# Patient Record
Sex: Female | Born: 1990 | Race: Black or African American | Hispanic: No | Marital: Single | Smoking: Never smoker
Health system: Southern US, Community
[De-identification: ages and names within clinical notes are randomized; demographics above are authoritative.]

## PROBLEM LIST (undated history)

## (undated) ENCOUNTER — Inpatient Hospital Stay (HOSPITAL_COMMUNITY): Payer: Self-pay

## (undated) DIAGNOSIS — Z8619 Personal history of other infectious and parasitic diseases: Secondary | ICD-10-CM

## (undated) HISTORY — DX: Personal history of other infectious and parasitic diseases: Z86.19

---
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
## 2011-02-21 IMAGING — CR DG CHEST 2V
2 series · 2 of 2 positions shown · non-contrast
Comparison: None

05/05/2009 – CORRECTED ORDERING PHYSICIAN:  Due to an administrative error, this report was originally sent to the wrong physician.  The report now reflects the correct ordering/requesting physician.
CLINICAL DATA: Cough and fever. Sore throat.

 CHEST - 2 VIEW

[view not recorded (1 of 2)]
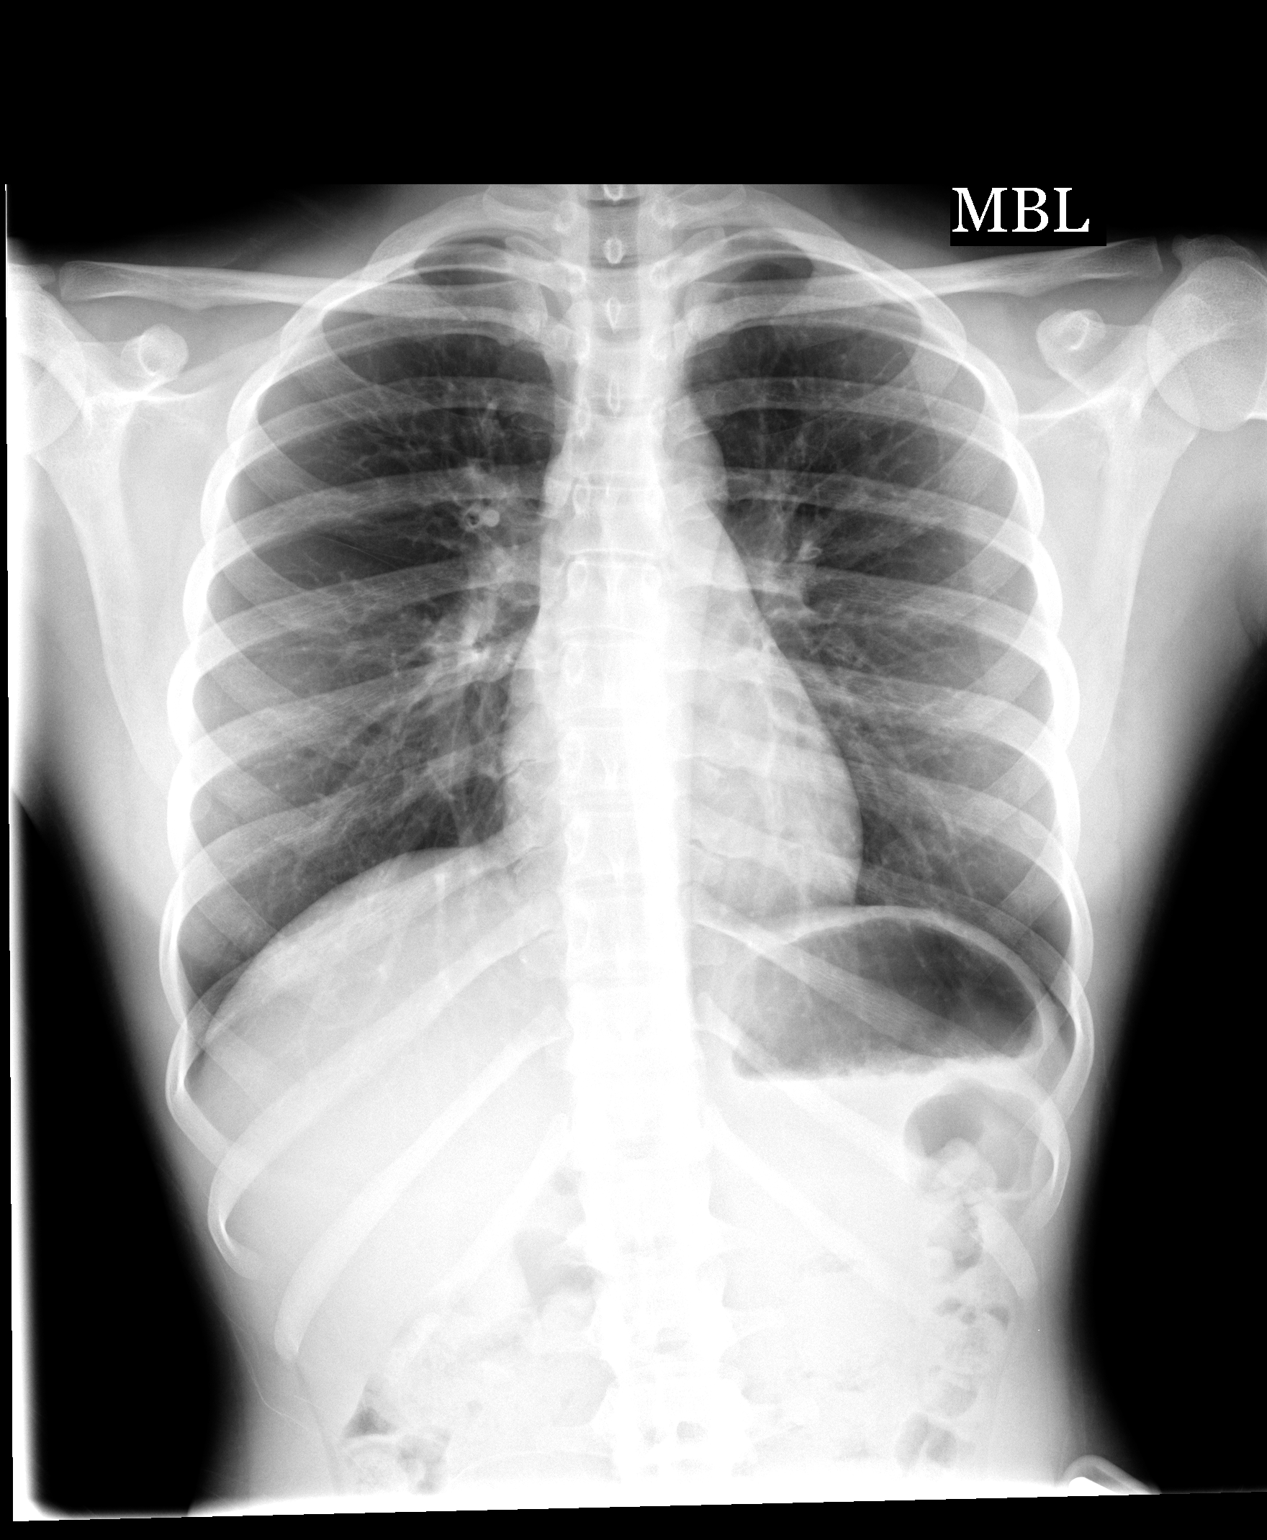

[view not recorded (2 of 2)]
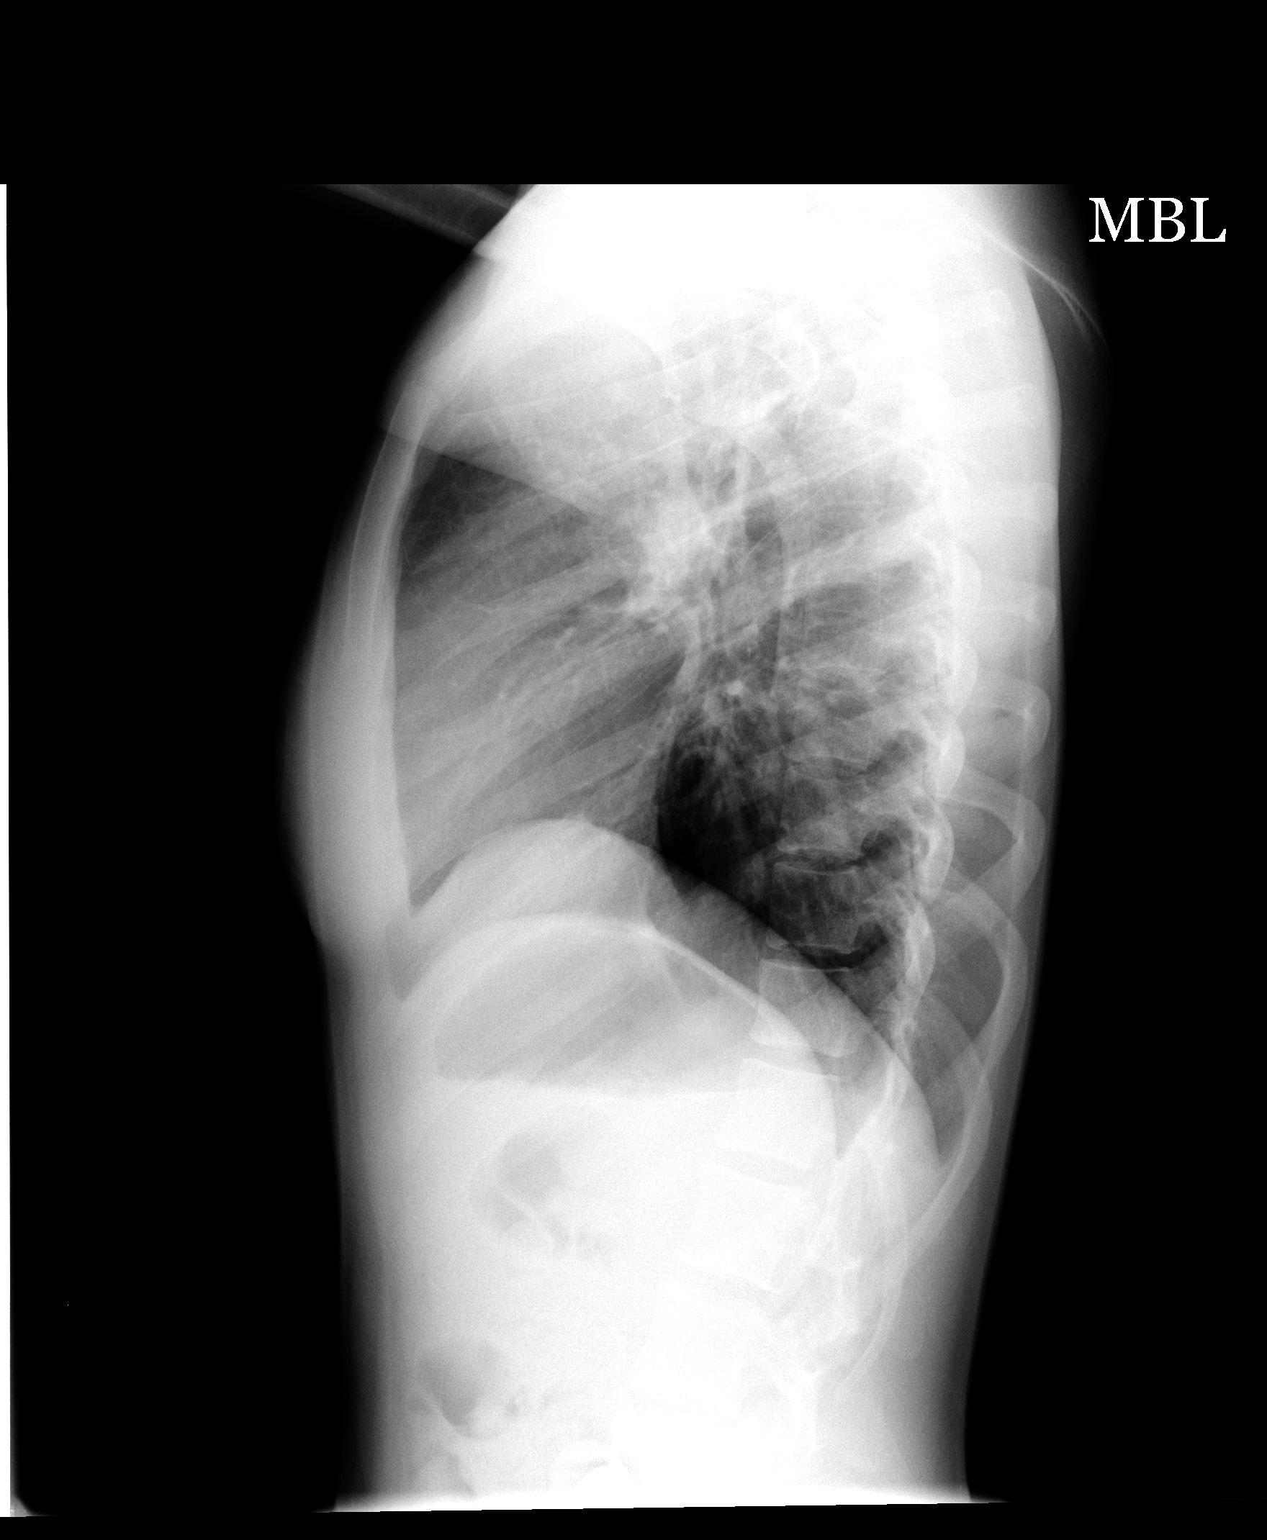

[2 of 2 positions shown; findings below may reference images not displayed]

FINDINGS: There is peribronchial thickening consistent with
 bronchitis.

 The heart size and vascularity are normal. There are no discrete
 infiltrates or effusions. No significant bony abnormality.
IMPRESSION: Mild bronchitic changes.

## 2012-12-08 ENCOUNTER — Emergency Department (HOSPITAL_COMMUNITY)
Admission: EM | Admit: 2012-12-08 | Discharge: 2012-12-08 | Disposition: A | Discharge status: Home / Self Care | Payer: Self-pay | Attending: Emergency Medicine | Admitting: Emergency Medicine

## 2012-12-08 ENCOUNTER — Emergency Department (HOSPITAL_COMMUNITY)
Admission: EM | Admit: 2012-12-08 | Discharge: 2012-12-08 | Disposition: A | Discharge status: Home / Self Care | Payer: Self-pay | Source: Home / Self Care

## 2012-12-08 ENCOUNTER — Encounter (HOSPITAL_COMMUNITY): Payer: Self-pay | Admitting: Emergency Medicine

## 2012-12-08 DIAGNOSIS — R112 Nausea with vomiting, unspecified: Secondary | ICD-10-CM | POA: Insufficient documentation

## 2012-12-08 DIAGNOSIS — K2900 Acute gastritis without bleeding: Secondary | ICD-10-CM | POA: Insufficient documentation

## 2012-12-08 DIAGNOSIS — K2970 Gastritis, unspecified, without bleeding: Secondary | ICD-10-CM

## 2012-12-08 DIAGNOSIS — Z3202 Encounter for pregnancy test, result negative: Secondary | ICD-10-CM | POA: Insufficient documentation

## 2012-12-08 LAB — CBC WITH DIFFERENTIAL/PLATELET
Basophils Absolute: 0 10*3/uL (ref 0.0–0.1)
Basophils Relative: 0 % (ref 0–1)
Eosinophils Relative: 0 % (ref 0–5)
HCT: 37.6 % (ref 36.0–46.0)
Lymphocytes Relative: 19 % (ref 12–46)
MCHC: 35.4 g/dL (ref 30.0–36.0)
MCV: 78 fL (ref 78.0–100.0)
Monocytes Absolute: 0.4 10*3/uL (ref 0.1–1.0)
Platelets: 231 10*3/uL (ref 150–400)
RDW: 13 % (ref 11.5–15.5)
WBC: 7 10*3/uL (ref 4.0–10.5)

## 2012-12-08 LAB — COMPREHENSIVE METABOLIC PANEL
ALT: 14 U/L (ref 0–35)
AST: 22 U/L (ref 0–37)
Albumin: 4.5 g/dL (ref 3.5–5.2)
CO2: 19 mEq/L (ref 19–32)
Calcium: 9.4 mg/dL (ref 8.4–10.5)
Creatinine, Ser: 0.77 mg/dL (ref 0.50–1.10)
GFR calc non Af Amer: >90 mL/min (ref >90)
Sodium: 138 mEq/L (ref 135–145)
Total Protein: 7.8 g/dL (ref 6.0–8.3)

## 2012-12-08 LAB — URINALYSIS, ROUTINE W REFLEX MICROSCOPIC
Glucose, UA: NEGATIVE mg/dL
Hgb urine dipstick: NEGATIVE
Specific Gravity, Urine: 1.036 — ABNORMAL HIGH (ref 1.005–1.030)
Urobilinogen, UA: 0.2 mg/dL (ref 0.0–1.0)
pH: 6 (ref 5.0–8.0)

## 2012-12-08 LAB — URINE MICROSCOPIC-ADD ON

## 2012-12-08 LAB — POCT PREGNANCY, URINE: Preg Test, Ur: NEGATIVE

## 2012-12-08 MED ORDER — ONDANSETRON 4 MG PO TBDP
4.0000 mg | ORAL_TABLET | Freq: Once | ORAL | Status: AC
  Administered 2012-12-08: 4 mg via ORAL
  Filled 2012-12-08: qty 1

## 2012-12-08 MED ORDER — ONDANSETRON HCL 4 MG PO TABS: 4.0000 mg | ORAL_TABLET | Freq: Four times a day (QID) | ORAL | Status: DC

## 2012-12-08 MED ORDER — POTASSIUM CHLORIDE CRYS ER 20 MEQ PO TBCR
40.0000 meq | EXTENDED_RELEASE_TABLET | Freq: Once | ORAL | Status: AC
  Administered 2012-12-08: 40 meq via ORAL
  Filled 2012-12-08: qty 2

## 2012-12-08 NOTE — ED Provider Notes (Signed)
Medical screening examination/treatment/procedure(s) were performed by non-physician practitioner and as supervising physician I was immediately available for consultation/collaboration.   Audree Camel, MD 12/08/12 [phone]

## 2012-12-08 NOTE — ED Provider Notes (Signed)
CSN: [id number]     Arrival date & time 12/08/12  1104 History   First MD Initiated Contact with Patient 12/08/12 1158     Chief Complaint  Patient presents with  . Nausea  . Emesis   (Consider location/radiation/quality/duration/timing/severity/associated sxs/prior Treatment) The history is provided by the patient. No language interpreter was used.  Lynn Wright is a 22 year old female sent into the emergency department 2 days of emesis and nausea. Patient reported that today she had one episode of emesis while yesterday she had a least more than 5 episodes starting around 2:00 PM. Patient reported that she doesn't have any abdominal pain, reported abdominal soreness secondary to continuous emesis. Patient reported that she's been feeling rather nauseous throughout the day. Patient reported that she try to drink ginger ale and eat pizza this morning, reported that the food came right back up. Patient reported that her last bowel movement was yesterday. Denied abdominal pain, headache, dizziness, chest pain, shortness of breath, difficulty breathing, hematuria, dysuria, melena, hematochezia, diarrhea. PCP none  History reviewed. No pertinent past medical history. History reviewed. No pertinent past surgical history. No family history on file. History  Substance Use Topics  . Smoking status: Never Smoker   . Smokeless tobacco: Not on file  . Alcohol Use: No   OB History   Grav Para Term Preterm Abortions TAB SAB Ect Mult Living                 Review of Systems  Constitutional: Negative for fever and chills.  HENT: Negative for trouble swallowing and neck pain.   Eyes: Negative for visual disturbance.  Respiratory: Negative for chest tightness and shortness of breath.   Cardiovascular: Negative for chest pain.  Gastrointestinal: Positive for nausea and vomiting. Negative for abdominal pain, diarrhea, constipation, blood in stool and anal bleeding.  Neurological: Negative for  dizziness and weakness.  All other systems reviewed and are negative.    Allergies  Review of patient's allergies indicates no known allergies.  Home Medications   Current Outpatient Rx  Name  Route  Sig  Dispense  Refill  . Ibuprofen (IBU PO)   Oral   Take 1-2 tablets by mouth every 6 (six) hours as needed (pain).         . ondansetron (ZOFRAN) 4 MG tablet   Oral   Take 1 tablet (4 mg total) by mouth every 6 (six) hours.   12 tablet   0    BP 106/72  Pulse 118  Temp(Src) 98.6 F (37 C) (Oral)  Resp 18  Ht 4\' 11"  (1.499 m)  SpO2 100%  LMP 11/30/2012 Physical Exam  Nursing note and vitals reviewed. Constitutional: She is oriented to person, place, and time. She appears well-developed and well-nourished. No distress.  HENT:  Head: Normocephalic and atraumatic.  Mouth/Throat: Oropharynx is clear and moist. No oropharyngeal exudate.  Mucous membranes appear moist  Eyes: Conjunctivae and EOM are normal. Pupils are equal, round, and reactive to light. Right eye exhibits no discharge. Left eye exhibits no discharge.  Neck: Normal range of motion. Neck supple.  Negative neck stiffness Negative nuchal rigidity Negative lymphadenopathy Negative cervical spine tenderness upon palpation  Cardiovascular: Normal rate, regular rhythm and normal heart sounds.  Exam reveals no friction rub.   No murmur heard. Pulses:      Radial pulses are 2+ on the right side, and 2+ on the left side.       Dorsalis pedis pulses are 2+ on  the right side, and 2+ on the left side.  Tachycardia not noted upon auscultation, normal rhythm  Pulmonary/Chest: Effort normal and breath sounds normal. No respiratory distress. She has no wheezes. She has no rales.  Abdominal: Soft. Bowel sounds are normal. She exhibits no distension. There is no tenderness. There is no rebound and no guarding.  Musculoskeletal: Normal range of motion.  Lymphadenopathy:    She has no cervical adenopathy.  Neurological:  She is alert and oriented to person, place, and time. She exhibits normal muscle tone. Coordination normal.  Skin: Skin is warm and dry. No rash noted. She is not diaphoretic. No erythema.  Psychiatric: She has a normal mood and affect. Her behavior is normal. Thought content normal.    ED Course  Procedures (including critical care time)  1:10 PM Nurse came to this provider if any patient can have anything by mouth, nurse reported that patient is hungry and wants to eat.  3:03 PM Patient reassessed. Patient sitting comfortably in bed. Patient tolerated by mouth challenge with crackers and ginger ale. No episodes of vomiting while in the emergency department.  Labs Review Labs Reviewed  COMPREHENSIVE METABOLIC PANEL - Abnormal; Notable for the following:    Potassium 3.3 (*)    All other components within normal limits  URINALYSIS, ROUTINE W REFLEX MICROSCOPIC - Abnormal; Notable for the following:    APPearance CLOUDY (*)    Specific Gravity, Urine 1.036 (*)    Bilirubin Urine SMALL (*)    Ketones, ur >80 (*)    Protein, ur 30 (*)    All other components within normal limits  CBC WITH DIFFERENTIAL  URINE MICROSCOPIC-ADD ON  POCT PREGNANCY, URINE   Imaging Review No results found.  MDM   1. Gastritis   2. Nausea and vomiting      Patient presenting to emergency department with nausea and vomiting has been ongoing for the past 2 days. Patient denied any abdominal pain, diarrhea, melena, hematochezia. Alert and oriented. Patient laughing smiling sitting upright in bed. Negative acute abdomen, negative peritoneal signs. Bowel sounds normoactive in all 4 quadrants. Negative pain upon palpation to all 4 quadrants. Moist mucous membranes noted. Urine negative for pregnancy. Urine negative for infection or leukocytosis, dehydration noted. CBC negative white blood cell elevation, negative findings. CMP mild decrease in potassium, 3.3, potassium by mouth given. Patient stable,  afebrile - aseptic appearing. Suspicion to be possible gastritis, nausea and emesis. No episodes of emesis while in ED setting. Patient tolerated PO fluids and crackers. Patient reported that she is feeling better. Discussed case with Dr. Alric Ran, cleared patient for discharge. Discharged patient with anti-emetics medications to be used as needed. Discussed with patient to rest and stay hydrated. [facility]. Discussed diet with patient. Discussed with patient to continue to monitor symptoms and if symptoms are to worsen or change report back to emergency department to return structures given. Patient agreed to plan of care, understood, all questions answered.    Raymon Mutton, PA-C 12/08/12 1704

## 2012-12-08 NOTE — ED Notes (Addendum)
Pt c/o N/V x 3 days. Pt reports muscle pain in abdominal area. Pt ate pizza today and vomited. Pt denies vaginal discharge or urinary symptoms.

## 2013-04-01 ENCOUNTER — Encounter (HOSPITAL_COMMUNITY): Payer: Self-pay | Admitting: *Deleted

## 2013-04-01 ENCOUNTER — Inpatient Hospital Stay (HOSPITAL_COMMUNITY)
Admission: AD | Admit: 2013-04-01 | Discharge: 2013-04-01 | Disposition: A | Discharge status: Home / Self Care | Payer: Medicaid Other | Source: Ambulatory Visit | Attending: Obstetrics & Gynecology | Admitting: Obstetrics & Gynecology

## 2013-04-01 ENCOUNTER — Inpatient Hospital Stay (HOSPITAL_COMMUNITY): Payer: Medicaid Other

## 2013-04-01 DIAGNOSIS — O2390 Unspecified genitourinary tract infection in pregnancy, unspecified trimester: Secondary | ICD-10-CM | POA: Insufficient documentation

## 2013-04-01 DIAGNOSIS — O210 Mild hyperemesis gravidarum: Secondary | ICD-10-CM | POA: Insufficient documentation

## 2013-04-01 DIAGNOSIS — R109 Unspecified abdominal pain: Secondary | ICD-10-CM | POA: Insufficient documentation

## 2013-04-01 DIAGNOSIS — B373 Candidiasis of vulva and vagina: Secondary | ICD-10-CM

## 2013-04-01 DIAGNOSIS — O219 Vomiting of pregnancy, unspecified: Secondary | ICD-10-CM

## 2013-04-01 DIAGNOSIS — O26899 Other specified pregnancy related conditions, unspecified trimester: Secondary | ICD-10-CM

## 2013-04-01 DIAGNOSIS — B3731 Acute candidiasis of vulva and vagina: Secondary | ICD-10-CM | POA: Insufficient documentation

## 2013-04-01 LAB — POCT PREGNANCY, URINE: Preg Test, Ur: POSITIVE — AB

## 2013-04-01 LAB — HCG, QUANTITATIVE, PREGNANCY: hCG, Beta Chain, Quant, S: 151911 m[IU]/mL — ABNORMAL HIGH

## 2013-04-01 LAB — CBC
HCT: 31.9 % — ABNORMAL LOW (ref 36.0–46.0)
Hemoglobin: 11 g/dL — ABNORMAL LOW (ref 12.0–15.0)
MCH: 26.6 pg (ref 26.0–34.0)
MCHC: 34.5 g/dL (ref 30.0–36.0)
MCV: 77.1 fL — ABNORMAL LOW (ref 78.0–100.0)
Platelets: 207 10*3/uL (ref 150–400)
RBC: 4.14 MIL/uL (ref 3.87–5.11)
RDW: 12.8 % (ref 11.5–15.5)
WBC: 6.9 10*3/uL (ref 4.0–10.5)

## 2013-04-01 LAB — WET PREP, GENITAL: Clue Cells Wet Prep HPF POC: NONE SEEN

## 2013-04-01 LAB — URINALYSIS, ROUTINE W REFLEX MICROSCOPIC
Glucose, UA: NEGATIVE mg/dL
Ketones, ur: NEGATIVE mg/dL
Leukocytes, UA: NEGATIVE
Protein, ur: NEGATIVE mg/dL
Urobilinogen, UA: 0.2 mg/dL (ref 0.0–1.0)

## 2013-04-01 LAB — ABO/RH: ABO/RH(D): O POS

## 2013-04-01 MED ORDER — ONDANSETRON 8 MG PO TBDP
8.0000 mg | ORAL_TABLET | Freq: Once | ORAL | Status: AC
  Administered 2013-04-01: 8 mg via ORAL
  Filled 2013-04-01: qty 1

## 2013-04-01 MED ORDER — FLUCONAZOLE 150 MG PO TABS: 150.0000 mg | ORAL_TABLET | Freq: Once | ORAL | Status: DC

## 2013-04-01 MED ORDER — PROMETHAZINE HCL 25 MG PO TABS: 25.0000 mg | ORAL_TABLET | Freq: Four times a day (QID) | ORAL | Status: DC | PRN

## 2013-04-01 NOTE — MAU Provider Note (Signed)
History     CSN: [id number]  Arrival date and time: 04/01/13 1529   None     Chief Complaint  Patient presents with  . Possible Pregnancy  . Abdominal Pain   HPI Pt is G1P0  [redacted]w[redacted]d pregnant  RN note: Patient states she had a slightly positive home pregnancy test 2 days ago. Has had abdominal pain for about one week, worse today, worse on the left side.  The pain is episdoic described as aching.Pt has been nauseated and vomiting in the mornings and at night.  Pt ate last about 2 pm and ate meatloaf and green beans. Denies bleeding, discharge, nausea or vomiting. Pt experienced some pain with urination this last episode.  Pt denies frequency of urination or incomoplete emptying of bladder.    No past medical history on file.  No past surgical history on file.  No family history on file.  History  Substance Use Topics  . Smoking status: Never Smoker   . Smokeless tobacco: Not on file  . Alcohol Use: No    Allergies: No Known Allergies  Prescriptions prior to admission  Medication Sig Dispense Refill  . Ibuprofen (IBU PO) Take 1-2 tablets by mouth every 6 (six) hours as needed (pain).      . ondansetron (ZOFRAN) 4 MG tablet Take 1 tablet (4 mg total) by mouth every 6 (six) hours.  12 tablet  0    ROS Physical Exam   Blood pressure 125/66, pulse 107, temperature 98.8 F (37.1 C), temperature source Oral, resp. rate 16, height 5' (1.524 m), weight 43.364 kg (95 lb 9.6 oz), SpO2 100.00%.  Physical Exam  Nursing note and vitals reviewed. Constitutional: She is oriented to person, place, and time. She appears well-developed and well-nourished. No distress.  HENT:  Head: Normocephalic.  Eyes: Pupils are equal, round, and reactive to light.  Neck: Normal range of motion. Neck supple.  Cardiovascular: Normal rate.   Respiratory: Effort normal.  GI: Soft. She exhibits no distension. There is no tenderness. There is no rebound and no guarding.  Genitourinary:  Creamy white  vaginal discharge in vaginal vault; uterus anteflexed 8-10 week size NT; adnexa without palpable enlargement or tenderness  Musculoskeletal: Normal range of motion.  Neurological: She is alert and oriented to person, place, and time.  Skin: Skin is warm and dry.  Psychiatric: She has a normal mood and affect.    MAU Course  Procedures Results for orders placed during the hospital encounter of 04/01/13 (from the past 24 hour(s))  URINALYSIS, ROUTINE W REFLEX MICROSCOPIC     Status: None   Collection Time    04/01/13  3:45 PM      Result Value Range   Color, Urine YELLOW  YELLOW   APPearance CLEAR  CLEAR   Specific Gravity, Urine 1.025  1.005 - 1.030   pH 7.0  5.0 - 8.0   Glucose, UA NEGATIVE  NEGATIVE mg/dL   Hgb urine dipstick NEGATIVE  NEGATIVE   Bilirubin Urine NEGATIVE  NEGATIVE   Ketones, ur NEGATIVE  NEGATIVE mg/dL   Protein, ur NEGATIVE  NEGATIVE mg/dL   Urobilinogen, UA 0.2  0.0 - 1.0 mg/dL   Nitrite NEGATIVE  NEGATIVE   Leukocytes, UA NEGATIVE  NEGATIVE  POCT PREGNANCY, URINE     Status: Abnormal   Collection Time    04/01/13  3:48 PM      Result Value Range   Preg Test, Ur POSITIVE (*) NEGATIVE  WET PREP, GENITAL  Status: Abnormal   Collection Time    04/01/13  4:27 PM      Result Value Range   Yeast Wet Prep HPF POC FEW (*) NONE SEEN   Trich, Wet Prep NONE SEEN  NONE SEEN   Clue Cells Wet Prep HPF POC NONE SEEN  NONE SEEN   WBC, Wet Prep HPF POC FEW (*) NONE SEEN  CBC     Status: Abnormal   Collection Time    04/01/13  4:30 PM      Result Value Range   WBC 6.9  4.0 - 10.5 K/uL   RBC 4.14  3.87 - 5.11 MIL/uL   Hemoglobin 11.0 (*) 12.0 - 15.0 g/dL   HCT 62.1 (*) 30.8 - 65.7 %   MCV 77.1 (*) 78.0 - 100.0 fL   MCH 26.6  26.0 - 34.0 pg   MCHC 34.5  30.0 - 36.0 g/dL   RDW 84.6  96.2 - 95.2 %   Platelets 207  150 - 400 K/uL  ABO/RH     Status: None   Collection Time    04/01/13  4:30 PM      Result Value Range   ABO/RH(D) O POS     US Ob Comp Less 14  Wks  04/01/2013   CLINICAL DATA:  Abdominal cramping. Eight weeks and 3 days pregnant by last menstrual period. Quantitative beta HCG pending.  EXAM: OBSTETRIC <14 WK ULTRASOUND  TECHNIQUE: Transabdominal ultrasound was performed for evaluation of the gestation as well as the maternal uterus and adnexal regions.  COMPARISON:  None.  FINDINGS: Intrauterine gestational sac: Visualized/normal in shape.  Yolk sac:  Visualized/ normal in shape.  Embryo:  Visualized  Cardiac Activity: Visualized  Heart Rate: 158 bpm  CRL:   22.7  mm   9 w 0 d                  [place] EDC: 11/04/2013  Maternal uterus/adnexae: No subchorionic hemorrhage. Normal appearing maternal ovaries. No free peritoneal fluid.  IMPRESSION: Single live intrauterine gestation with an estimated gestational age of [redacted] weeks and 0 days. No complicating features.   Electronically Signed   By: Gordan Payment M.D.   On: 04/01/2013 17:14   Assessment and Plan  abd pain in pregnancy  Viable 9w 0 d IUP Nausea and vomiting in pregnancy Yeast vaginitis- Diflucan 150mg  #2- one now repeat 3 days for yeast  F/u for OB care  Kartier Bennison 04/01/2013, 3:40 PM

## 2013-04-01 NOTE — MAU Note (Signed)
Patient states she had a slightly positive home pregnancy test 2 days ago. Has had abdominal pain for about one week, worse today. Denies bleeding, discharge, nausea or vomiting.

## 2013-04-02 LAB — GC/CHLAMYDIA PROBE AMP: GC Probe RNA: NEGATIVE

## 2013-04-02 NOTE — MAU Provider Note (Signed)
Attestation of Attending Supervision of Advanced Practitioner (PA/CNM/NP): Evaluation and management procedures were performed by the Advanced Practitioner under my supervision and collaboration.  I have reviewed the Advanced Practitioner's note and chart, and I agree with the management and plan.  Sharone Picchi, MD, FACOG Attending Obstetrician & Gynecologist Faculty Practice, [facility]  

## 2013-04-09 ENCOUNTER — Emergency Department (HOSPITAL_COMMUNITY)
Admission: EM | Admit: 2013-04-09 | Discharge: 2013-04-09 | Disposition: A | Discharge status: Home / Self Care | Payer: Medicaid Other | Attending: Emergency Medicine | Admitting: Emergency Medicine

## 2013-04-09 ENCOUNTER — Encounter (HOSPITAL_COMMUNITY): Payer: Self-pay | Admitting: Emergency Medicine

## 2013-04-09 DIAGNOSIS — R059 Cough, unspecified: Secondary | ICD-10-CM | POA: Insufficient documentation

## 2013-04-09 DIAGNOSIS — Z79899 Other long term (current) drug therapy: Secondary | ICD-10-CM | POA: Insufficient documentation

## 2013-04-09 DIAGNOSIS — R05 Cough: Secondary | ICD-10-CM

## 2013-04-09 DIAGNOSIS — R000 Tachycardia, unspecified: Secondary | ICD-10-CM | POA: Insufficient documentation

## 2013-04-09 DIAGNOSIS — O9989 Other specified diseases and conditions complicating pregnancy, childbirth and the puerperium: Secondary | ICD-10-CM | POA: Insufficient documentation

## 2013-04-09 NOTE — ED Provider Notes (Signed)
CSN: [id number]     Arrival date & time 04/09/13  0631 History   First MD Initiated Contact with Patient 04/09/13 [phone]     Chief Complaint  Patient presents with  . Cough   (Consider location/radiation/quality/duration/timing/severity/associated sxs/prior Treatment) HPI Comments: Patient presents to the ED with a chief complaint of cough x 1 week.  She is [redacted] weeks pregnant.  She denies fevers or chills.  Denies any other associated symptoms such as nausea, vomiting, diarrhea, constipation, or body aches.  She states that the cough is sometimes productive.  She denies any other health problems.    The history is provided by the patient. No language interpreter was used.    History reviewed. No pertinent past medical history. History reviewed. No pertinent past surgical history. No family history on file. History  Substance Use Topics  . Smoking status: Never Smoker   . Smokeless tobacco: Not on file  . Alcohol Use: No   OB History   Grav Para Term Preterm Abortions TAB SAB Ect Mult Living   1              Review of Systems  All other systems reviewed and are negative.    Allergies  Review of patient's allergies indicates no known allergies.  Home Medications   Current Outpatient Rx  Name  Route  Sig  Dispense  Refill  . fluconazole (DIFLUCAN) 150 MG tablet   Oral   Take 1 tablet (150 mg total) by mouth once.   2 tablet   3   . promethazine (PHENERGAN) 25 MG tablet   Oral   Take 1 tablet (25 mg total) by mouth every 6 (six) hours as needed for nausea or vomiting.   30 tablet   0    BP 111/71  Pulse 102  Temp(Src) 97.7 F (36.5 C) (Oral)  Resp 16  Ht 4\' 11"  (1.499 m)  Wt 101 lb (45.813 kg)  BMI 20.39 kg/m2  SpO2 100%  LMP 02/01/2013 Physical Exam  Nursing note and vitals reviewed. Constitutional: She is oriented to person, place, and time. She appears well-developed and well-nourished.  HENT:  Head: Normocephalic and atraumatic.  Eyes: Conjunctivae and  EOM are normal. Pupils are equal, round, and reactive to light.  Neck: Normal range of motion. Neck supple.  Cardiovascular: Regular rhythm.  Exam reveals no gallop and no friction rub.   No murmur heard. Mildly tachycardic  Pulmonary/Chest: Effort normal and breath sounds normal. No respiratory distress. She has no wheezes. She has no rales. She exhibits no tenderness.  Clear to auscultation bilaterally  Abdominal: Soft. She exhibits no distension and no mass. There is no tenderness. There is no rebound and no guarding.  No focal abdominal tenderness  Musculoskeletal: Normal range of motion. She exhibits no edema and no tenderness.  Neurological: She is alert and oriented to person, place, and time.  Skin: Skin is warm and dry.  Psychiatric: She has a normal mood and affect. Her behavior is normal. Judgment and thought content normal.    ED Course  Procedures (including critical care time) Labs Review Labs Reviewed - No data to display Imaging Review No results found.  EKG Interpretation   None       MDM   1. Cough     Patients symptoms are consistent with URI, likely viral etiology. Discussed that antibiotics are not indicated for viral infections. Pt will be discharged with symptomatic treatment.  Verbalizes understanding and is agreeable with plan. Pt  is hemodynamically stable & in NAD prior to dc.       Roxy Horseman, PA-C 04/09/13 [phone]

## 2013-04-09 NOTE — ED Notes (Addendum)
Pt. reports persistent productive cough with chest congestion for several days , denies fever or chills, respirations unlabored. Pt. states she is [redacted] weeks pregnant.

## 2013-04-09 NOTE — ED Provider Notes (Signed)
Medical screening examination/treatment/procedure(s) were performed by non-physician practitioner and as supervising physician I was immediately available for consultation/collaboration.  EKG Interpretation   None        Hurman Horn, MD 04/09/13 2103

## 2013-04-24 ENCOUNTER — Encounter (HOSPITAL_COMMUNITY): Payer: Self-pay | Admitting: Emergency Medicine

## 2013-04-24 ENCOUNTER — Emergency Department (HOSPITAL_COMMUNITY)
Admission: EM | Admit: 2013-04-24 | Discharge: 2013-04-24 | Disposition: A | Discharge status: Home / Self Care | Payer: Medicaid Other | Attending: Emergency Medicine | Admitting: Emergency Medicine

## 2013-04-24 DIAGNOSIS — O219 Vomiting of pregnancy, unspecified: Secondary | ICD-10-CM

## 2013-04-24 DIAGNOSIS — Z3490 Encounter for supervision of normal pregnancy, unspecified, unspecified trimester: Secondary | ICD-10-CM

## 2013-04-24 DIAGNOSIS — O210 Mild hyperemesis gravidarum: Secondary | ICD-10-CM | POA: Insufficient documentation

## 2013-04-24 LAB — URINALYSIS, ROUTINE W REFLEX MICROSCOPIC
Bilirubin Urine: NEGATIVE
Glucose, UA: NEGATIVE mg/dL
HGB URINE DIPSTICK: NEGATIVE
Ketones, ur: >80 mg/dL — AB
Leukocytes, UA: NEGATIVE
Nitrite: NEGATIVE
Protein, ur: NEGATIVE mg/dL
Specific Gravity, Urine: 1.034 — ABNORMAL HIGH (ref 1.005–1.030)
UROBILINOGEN UA: 0.2 mg/dL (ref 0.0–1.0)
pH: 6 (ref 5.0–8.0)

## 2013-04-24 LAB — BASIC METABOLIC PANEL
BUN: 13 mg/dL (ref 6–23)
CALCIUM: 9.2 mg/dL (ref 8.4–10.5)
CO2: 22 meq/L (ref 19–32)
Chloride: 99 mEq/L (ref 96–112)
Creatinine, Ser: 0.54 mg/dL (ref 0.50–1.10)
Glucose, Bld: 88 mg/dL (ref 70–99)
Potassium: 3.6 mEq/L — ABNORMAL LOW (ref 3.7–5.3)
SODIUM: 136 meq/L — AB (ref 137–147)

## 2013-04-24 LAB — CBC
HCT: 33.1 % — ABNORMAL LOW (ref 36.0–46.0)
Hemoglobin: 11.4 g/dL — ABNORMAL LOW (ref 12.0–15.0)
MCH: 27.2 pg (ref 26.0–34.0)
MCHC: 34.4 g/dL (ref 30.0–36.0)
MCV: 79 fL (ref 78.0–100.0)
PLATELETS: 227 10*3/uL (ref 150–400)
RBC: 4.19 MIL/uL (ref 3.87–5.11)
RDW: 13.1 % (ref 11.5–15.5)
WBC: 6.1 10*3/uL (ref 4.0–10.5)

## 2013-04-24 LAB — PREGNANCY, URINE: Preg Test, Ur: POSITIVE — AB

## 2013-04-24 MED ORDER — SODIUM CHLORIDE 0.9 % IV BOLUS (SEPSIS)
1000.0000 mL | Freq: Once | INTRAVENOUS | Status: AC
  Administered 2013-04-24: 1000 mL via INTRAVENOUS

## 2013-04-24 MED ORDER — ONDANSETRON HCL 4 MG PO TABS: 4.0000 mg | ORAL_TABLET | Freq: Four times a day (QID) | ORAL | Status: DC

## 2013-04-24 MED ORDER — ONDANSETRON HCL 4 MG/2ML IJ SOLN
4.0000 mg | Freq: Once | INTRAMUSCULAR | Status: AC
  Administered 2013-04-24: 4 mg via INTRAVENOUS
  Filled 2013-04-24: qty 2

## 2013-04-24 NOTE — ED Notes (Signed)
Pt ambulatory to bathroom

## 2013-04-24 NOTE — ED Notes (Signed)
Pt given applesauce. Pt asking to see MD. Pt appears to anxious to leave ED
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
## 2013-04-24 NOTE — Discharge Instructions (Signed)
Pregnancy, First Trimester The first trimester is the first 3 months your baby is growing inside you. It is important to follow your doctor's instructions. HOME CARE   Do not smoke.  Do not drink alcohol.  Only take medicine as told by your doctor.  Exercise.  Eat healthy foods. Eat regular, well-balanced meals.  You can have sex (intercourse) if there are no other problems with the pregnancy.  Things that help with morning sickness:  Eat soda crackers before getting up in the morning.  Eat 4 to 5 small meals rather than 3 large meals.  Drink liquids between meals, not during meals.  Go to all appointments as told.  Take all vitamins or supplements as told by your doctor. GET HELP RIGHT AWAY IF:   You develop a fever.  You have a bad smelling fluid that is leaking from your vagina.  There is bleeding from the vagina.  You develop severe belly (abdominal) or back pain.  You throw up (vomit) blood. It may look like coffee grounds.  You lose more than 2 pounds in a week.  You gain 5 pounds or more in a week.  You gain more than 2 pounds in a week and you see puffiness (swelling) in your feet, ankles, or legs.  You have severe dizziness or pass out (faint).  You are around people who have [place] measles, chickenpox, or fifth disease.  You have a headache, watery poop (diarrhea), pain with peeing (urinating), or cannot breath right. Document Released: 09/13/2007 Document Revised: 06/19/2011 Document Reviewed: 09/13/2007 [facility] Patient Information 2014 [place], [place].  Morning Sickness Morning sickness is when you feel sick to your stomach (nauseous) during pregnancy. This nauseous feeling may or may not come with vomiting. It often occurs in the morning but can be a problem any time of day. Morning sickness is most common during the first trimester, but it may continue throughout pregnancy. While morning sickness is unpleasant, it is usually harmless unless you  develop severe and continual vomiting (hyperemesis gravidarum). This condition requires more intense treatment.  CAUSES  The cause of morning sickness is not completely known but seems to be related to normal hormonal changes that occur in pregnancy. RISK FACTORS You are at greater risk if you:  Experienced nausea or vomiting before your pregnancy.  Had morning sickness during a previous pregnancy.  Are pregnant with more than one baby, such as twins. TREATMENT  Do not use any medicines (prescription, over-the-counter, or herbal) for morning sickness without first talking to your health care provider. Your health care provider may prescribe or recommend:  Vitamin B6 supplements.  Anti-nausea medicines.  The herbal medicine ginger. HOME CARE INSTRUCTIONS   Only take over-the-counter or prescription medicines as directed by your health care provider.  Taking multivitamins before getting pregnant can prevent or decrease the severity of morning sickness in most women.   Eat a piece of dry toast or unsalted crackers before getting out of bed in the morning.   Eat five or six small meals a day.   Eat dry and bland foods (rice, baked potato). Foods high in carbohydrates are often helpful.  Do not drink liquids with your meals. Drink liquids between meals.   Avoid greasy, fatty, and spicy foods.   Get someone to cook for you if the smell of any food causes nausea and vomiting.   If you feel nauseous after taking prenatal vitamins, take the vitamins at night or with a snack.  Snack on protein foods (  nuts, yogurt, cheese) between meals if you are hungry.   Eat unsweetened gelatins for desserts.   Wearing an acupressure wristband (worn for sea sickness) may be helpful.   Acupuncture may be helpful.   Do not smoke.   Get a humidifier to keep the air in your house free of odors.   Get plenty of fresh air. SEEK MEDICAL CARE IF:   Your home remedies are not  working, and you need medicine.  You feel dizzy or lightheaded.  You are losing weight. SEEK IMMEDIATE MEDICAL CARE IF:   You have persistent and uncontrolled nausea and vomiting.  You pass out (faint). Document Released: 05/18/2006 Document Revised: 11/27/2012 Document Reviewed: 09/11/2012 [facility] Patient Information 2014 [place], [place].

## 2013-04-24 NOTE — ED Notes (Signed)
Pt arrives with nausea/vomiting since last night. Multiple episodes. Pt denies fever, pain, discharge or bleeding. VSS.

## 2013-04-24 NOTE — ED Notes (Signed)
Sprite and Malaw[place]urkey sandwich provided.

## 2013-05-26 LAB — SICKLE CELL SCREEN: Sickle Cell Screen: NEGATIVE

## 2013-05-26 LAB — OB RESULTS CONSOLE ABO/RH: RH Type: POSITIVE

## 2013-05-26 LAB — OB RESULTS CONSOLE GC/CHLAMYDIA
CHLAMYDIA, DNA PROBE: NEGATIVE
Chlamydia: NEGATIVE
GC PROBE AMP, GENITAL: NEGATIVE
Gonorrhea: NEGATIVE

## 2013-05-26 LAB — OB RESULTS CONSOLE RUBELLA ANTIBODY, IGM: Rubella: IMMUNE

## 2013-05-26 LAB — OB RESULTS CONSOLE HIV ANTIBODY (ROUTINE TESTING)
HIV: NONREACTIVE
HIV: NONREACTIVE

## 2013-05-26 LAB — OB RESULTS CONSOLE RPR
RPR: NONREACTIVE
RPR: NONREACTIVE

## 2013-05-26 LAB — OB RESULTS CONSOLE HEPATITIS B SURFACE ANTIGEN: Hepatitis B Surface Ag: NEGATIVE

## 2013-05-26 LAB — OB RESULTS CONSOLE ANTIBODY SCREEN: ANTIBODY SCREEN: NEGATIVE

## 2013-06-30 ENCOUNTER — Inpatient Hospital Stay (HOSPITAL_COMMUNITY)
Admission: AD | Admit: 2013-06-30 | Discharge: 2013-06-30 | Discharge status: Against Medical Advice | Payer: Medicaid Other | Source: Ambulatory Visit | Attending: Obstetrics & Gynecology | Admitting: Obstetrics & Gynecology

## 2013-06-30 NOTE — MAU Note (Signed)
Patient is not in the lobby when called to triage.  

## 2013-07-01 ENCOUNTER — Encounter (HOSPITAL_COMMUNITY): Payer: Self-pay | Admitting: General Practice

## 2013-07-01 ENCOUNTER — Inpatient Hospital Stay (HOSPITAL_COMMUNITY): Payer: Medicaid Other

## 2013-07-01 ENCOUNTER — Inpatient Hospital Stay (HOSPITAL_COMMUNITY)
Admission: AD | Admit: 2013-07-01 | Discharge: 2013-07-01 | Disposition: A | Discharge status: Home / Self Care | Payer: Medicaid Other | Source: Ambulatory Visit | Attending: Obstetrics and Gynecology | Admitting: Obstetrics and Gynecology

## 2013-07-01 DIAGNOSIS — R109 Unspecified abdominal pain: Secondary | ICD-10-CM | POA: Insufficient documentation

## 2013-07-01 DIAGNOSIS — R1084 Generalized abdominal pain: Secondary | ICD-10-CM

## 2013-07-01 DIAGNOSIS — O26899 Other specified pregnancy related conditions, unspecified trimester: Secondary | ICD-10-CM

## 2013-07-01 DIAGNOSIS — O99891 Other specified diseases and conditions complicating pregnancy: Secondary | ICD-10-CM | POA: Insufficient documentation

## 2013-07-01 DIAGNOSIS — O9989 Other specified diseases and conditions complicating pregnancy, childbirth and the puerperium: Secondary | ICD-10-CM

## 2013-07-01 DIAGNOSIS — Y929 Unspecified place or not applicable: Secondary | ICD-10-CM | POA: Insufficient documentation

## 2013-07-01 LAB — URINALYSIS, ROUTINE W REFLEX MICROSCOPIC
Bilirubin Urine: NEGATIVE
Glucose, UA: NEGATIVE mg/dL
Hgb urine dipstick: NEGATIVE
Ketones, ur: NEGATIVE mg/dL
LEUKOCYTES UA: NEGATIVE
NITRITE: NEGATIVE
Protein, ur: NEGATIVE mg/dL
SPECIFIC GRAVITY, URINE: 1.025 (ref 1.005–1.030)
Urobilinogen, UA: 0.2 mg/dL (ref 0.0–1.0)
pH: 6 (ref 5.0–8.0)

## 2013-07-01 NOTE — Discharge Instructions (Signed)
Abdominal Pain During Pregnancy Abdominal pain is common in pregnancy. Most of the time, it does not cause harm. There are many causes of abdominal pain. Some causes are more serious than others. Some of the causes of abdominal pain in pregnancy are easily diagnosed. Occasionally, the diagnosis takes time to understand. Other times, the cause is not determined. Abdominal pain can be a sign that something is very wrong with the pregnancy, or the pain may have nothing to do with the pregnancy at all. For this reason, always tell your health care provider if you have any abdominal discomfort. HOME CARE INSTRUCTIONS  Monitor your abdominal pain for any changes. The following actions may help to alleviate any discomfort you are experiencing:  Do not have sexual intercourse or put anything in your vagina until your symptoms go away completely.  Get plenty of rest until your pain improves.  Drink clear fluids if you feel nauseous. Avoid solid food as long as you are uncomfortable or nauseous.  Only take over-the-counter or prescription medicine as directed by your health care provider.  Keep all follow-up appointments with your health care provider. SEEK IMMEDIATE MEDICAL CARE IF:  You are bleeding, leaking fluid, or passing tissue from the vagina.  You have increasing pain or cramping.  You have persistent vomiting.  You have painful or bloody urination.  You have a fever.  You notice a decrease in your baby's movements.  You have extreme weakness or feel faint.  You have shortness of breath, with or without abdominal pain.  You develop a severe headache with abdominal pain.  You have abnormal vaginal discharge with abdominal pain.  You have persistent diarrhea.  You have abdominal pain that continues even after rest, or gets worse. MAKE SURE YOU:   Understand these instructions.  Will watch your condition.  Will get help right away if you are not doing well or get  worse. Document Released: 03/27/2005 Document Revised: 01/15/2013 Document Reviewed: 10/24/2012 [facility] Patient Information 2014 [place], [place].  Abdominal Pain During Pregnancy Abdominal pain is common in pregnancy. Most of the time, it does not cause harm. There are many causes of abdominal pain. Some causes are more serious than others. Some of the causes of abdominal pain in pregnancy are easily diagnosed. Occasionally, the diagnosis takes time to understand. Other times, the cause is not determined. Abdominal pain can be a sign that something is very wrong with the pregnancy, or the pain may have nothing to do with the pregnancy at all. For this reason, always tell your health care provider if you have any abdominal discomfort. HOME CARE INSTRUCTIONS  Monitor your abdominal pain for any changes. The following actions may help to alleviate any discomfort you are experiencing:  Do not have sexual intercourse or put anything in your vagina until your symptoms go away completely.  Get plenty of rest until your pain improves.  Drink clear fluids if you feel nauseous. Avoid solid food as long as you are uncomfortable or nauseous.  Only take over-the-counter or prescription medicine as directed by your health care provider.  Keep all follow-up appointments with your health care provider. SEEK IMMEDIATE MEDICAL CARE IF:  You are bleeding, leaking fluid, or passing tissue from the vagina.  You have increasing pain or cramping.  You have persistent vomiting.  You have painful or bloody urination.  You have a fever.  You notice a decrease in your baby's movements.  You have extreme weakness or feel faint.  You have shortness  of breath, with or without abdominal pain.  You develop a severe headache with abdominal pain.  You have abnormal vaginal discharge with abdominal pain.  You have persistent diarrhea.  You have abdominal pain that continues even after rest, or gets  worse. MAKE SURE YOU:   Understand these instructions.  Will watch your condition.  Will get help right away if you are not doing well or get worse. Document Released: 03/27/2005 Document Revised: 01/15/2013 Document Reviewed: 10/24/2012 [facility] Patient Information 2014 [place], [place].
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
## 2013-10-08 LAB — OB RESULTS CONSOLE GBS: GBS: NEGATIVE

## 2013-10-08 LAB — OB RESULTS CONSOLE GC/CHLAMYDIA
CHLAMYDIA, DNA PROBE: NEGATIVE
GC PROBE AMP, GENITAL: NEGATIVE

## 2013-11-16 ENCOUNTER — Telehealth (HOSPITAL_COMMUNITY): Payer: Self-pay

## 2013-11-16 ENCOUNTER — Inpatient Hospital Stay (HOSPITAL_COMMUNITY)
Admission: AD | Admit: 2013-11-16 | Discharge: 2013-11-19 | DRG: 766 | Disposition: A | Discharge status: Home / Self Care | Payer: Medicaid Other | Source: Ambulatory Visit | Attending: Obstetrics & Gynecology | Admitting: Obstetrics & Gynecology

## 2013-11-16 ENCOUNTER — Encounter (HOSPITAL_COMMUNITY): Payer: Self-pay | Admitting: Obstetrics and Gynecology

## 2013-11-16 DIAGNOSIS — O480 Post-term pregnancy: Secondary | ICD-10-CM | POA: Diagnosis present

## 2013-11-16 DIAGNOSIS — Z3A41 41 weeks gestation of pregnancy: Secondary | ICD-10-CM

## 2013-11-16 DIAGNOSIS — Z98891 History of uterine scar from previous surgery: Secondary | ICD-10-CM

## 2013-11-16 LAB — CBC
HCT: 33.3 % — ABNORMAL LOW (ref 36.0–46.0)
HEMOGLOBIN: 11 g/dL — AB (ref 12.0–15.0)
MCH: 25.4 pg — AB (ref 26.0–34.0)
MCHC: 33 g/dL (ref 30.0–36.0)
MCV: 76.9 fL — ABNORMAL LOW (ref 78.0–100.0)
Platelets: 298 10*3/uL (ref 150–400)
RBC: 4.33 MIL/uL (ref 3.87–5.11)
RDW: 16.2 % — ABNORMAL HIGH (ref 11.5–15.5)
WBC: 9.3 10*3/uL (ref 4.0–10.5)

## 2013-11-16 LAB — TYPE AND SCREEN
ABO/RH(D): O POS
Antibody Screen: NEGATIVE

## 2013-11-16 MED ORDER — ONDANSETRON HCL 4 MG/2ML IJ SOLN: 4.0000 mg | Freq: Four times a day (QID) | INTRAMUSCULAR | Status: DC | PRN

## 2013-11-16 MED ORDER — ZOLPIDEM TARTRATE 5 MG PO TABS: 5.0000 mg | ORAL_TABLET | Freq: Every evening | ORAL | Status: DC | PRN

## 2013-11-16 MED ORDER — OXYTOCIN 40 UNITS IN LACTATED RINGERS INFUSION - SIMPLE MED: 62.5000 mL/h | INTRAVENOUS | Status: DC

## 2013-11-16 MED ORDER — IBUPROFEN 600 MG PO TABS
600.0000 mg | ORAL_TABLET | Freq: Four times a day (QID) | ORAL | Status: DC | PRN
Start: 2013-11-16 — End: 2013-11-17

## 2013-11-16 MED ORDER — MISOPROSTOL 25 MCG QUARTER TABLET
25.0000 ug | ORAL_TABLET | ORAL | Status: DC | PRN
  Administered 2013-11-16: 25 ug via VAGINAL
  Filled 2013-11-16: qty 0.25

## 2013-11-16 MED ORDER — CITRIC ACID-SODIUM CITRATE 334-500 MG/5ML PO SOLN
30.0000 mL | ORAL | Status: DC | PRN
Start: 2013-11-16 — End: 2013-11-17
  Administered 2013-11-17: 30 mL via ORAL
  Filled 2013-11-16: qty 15

## 2013-11-16 MED ORDER — LACTATED RINGERS IV SOLN
INTRAVENOUS | Status: DC
Start: 2013-11-16 — End: 2013-11-17
  Administered 2013-11-16 (×2): via INTRAVENOUS
  Administered 2013-11-17: 125 mL/h via INTRAVENOUS

## 2013-11-16 MED ORDER — LACTATED RINGERS IV SOLN: 500.0000 mL | INTRAVENOUS | Status: DC | PRN

## 2013-11-16 MED ORDER — TERBUTALINE SULFATE 1 MG/ML IJ SOLN: 0.2500 mg | Freq: Once | INTRAMUSCULAR | Status: AC | PRN

## 2013-11-16 MED ORDER — LIDOCAINE HCL (PF) 1 % IJ SOLN: 30.0000 mL | INTRAMUSCULAR | Status: DC | PRN

## 2013-11-16 MED ORDER — OXYCODONE-ACETAMINOPHEN 5-325 MG PO TABS: 1.0000 | ORAL_TABLET | ORAL | Status: DC | PRN

## 2013-11-16 MED ORDER — BUTORPHANOL TARTRATE 1 MG/ML IJ SOLN
1.0000 mg | INTRAMUSCULAR | Status: DC | PRN
  Administered 2013-11-17: 1 mg via INTRAVENOUS
  Filled 2013-11-16: qty 1

## 2013-11-16 MED ORDER — ACETAMINOPHEN 325 MG PO TABS: 650.0000 mg | ORAL_TABLET | ORAL | Status: DC | PRN

## 2013-11-16 MED ORDER — OXYTOCIN BOLUS FROM INFUSION: 500.0000 mL | INTRAVENOUS | Status: DC
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
## 2013-11-16 NOTE — Progress Notes (Signed)
Patient ID: Lynn Wright, female   DOB: 07-19-1990, 23 y.o.   MRN: [id number]  CTSP for fetal deceleration Pts FHR declined to 80-90, total decel lasted 7 minutes. It appeared that pt was having tachysystole at the time. Per RN pt was sitting up and flat. FHR spontaneously recovered afte IVF bolus and Supplemental O2 applied. Due for next misoprostal @ 0030. May hold off for a while Cvx tight 1/80/02 posterior

## 2013-11-17 ENCOUNTER — Encounter (HOSPITAL_COMMUNITY)
Admission: AD | Disposition: A | Discharge status: Home / Self Care | Payer: Self-pay | Source: Ambulatory Visit | Attending: Obstetrics & Gynecology

## 2013-11-17 ENCOUNTER — Encounter (HOSPITAL_COMMUNITY): Payer: Medicaid Other | Admitting: Anesthesiology

## 2013-11-17 ENCOUNTER — Inpatient Hospital Stay (HOSPITAL_COMMUNITY): Payer: Medicaid Other | Admitting: Anesthesiology

## 2013-11-17 ENCOUNTER — Encounter (HOSPITAL_COMMUNITY): Payer: Self-pay | Admitting: Anesthesiology

## 2013-11-17 DIAGNOSIS — Z98891 History of uterine scar from previous surgery: Secondary | ICD-10-CM

## 2013-11-17 LAB — RPR

## 2013-11-17 SURGERY — Surgical Case
Anesthesia: Epidural | Site: Abdomen

## 2013-11-17 MED ORDER — FENTANYL 2.5 MCG/ML BUPIVACAINE 1/10 % EPIDURAL INFUSION (WH - ANES)
INTRAMUSCULAR | Status: DC | PRN
  Administered 2013-11-17: 12 mL/h via EPIDURAL

## 2013-11-17 MED ORDER — ONDANSETRON HCL 4 MG/2ML IJ SOLN
INTRAMUSCULAR | Status: DC | PRN
  Administered 2013-11-17: 4 mg via INTRAVENOUS

## 2013-11-17 MED ORDER — SODIUM BICARBONATE 8.4 % IV SOLN
INTRAVENOUS | Status: DC | PRN
  Administered 2013-11-17 (×2): 5 mL via EPIDURAL
  Administered 2013-11-17: 3 mL via EPIDURAL

## 2013-11-17 MED ORDER — TERBUTALINE SULFATE 1 MG/ML IJ SOLN: 0.2500 mg | Freq: Once | INTRAMUSCULAR | Status: DC | PRN

## 2013-11-17 MED ORDER — LACTATED RINGERS IV SOLN
INTRAVENOUS | Status: DC | PRN
  Administered 2013-11-17: 13:00:00 via INTRAVENOUS

## 2013-11-17 MED ORDER — OXYTOCIN 10 UNIT/ML IJ SOLN
40.0000 [IU] | INTRAVENOUS | Status: DC | PRN
  Administered 2013-11-17: 40 [IU] via INTRAVENOUS

## 2013-11-17 MED ORDER — LANOLIN HYDROUS EX OINT: 1.0000 "application " | TOPICAL_OINTMENT | CUTANEOUS | Status: DC | PRN

## 2013-11-17 MED ORDER — ONDANSETRON HCL 4 MG/2ML IJ SOLN: 4.0000 mg | Freq: Three times a day (TID) | INTRAMUSCULAR | Status: DC | PRN

## 2013-11-17 MED ORDER — KETOROLAC TROMETHAMINE 30 MG/ML IJ SOLN
INTRAMUSCULAR | Status: AC
  Filled 2013-11-17: qty 1

## 2013-11-17 MED ORDER — NALBUPHINE HCL 10 MG/ML IJ SOLN
5.0000 mg | INTRAMUSCULAR | Status: DC | PRN
  Administered 2013-11-18: 10 mg via SUBCUTANEOUS
  Filled 2013-11-17: qty 1

## 2013-11-17 MED ORDER — TETANUS-DIPHTH-ACELL PERTUSSIS 5-2.5-18.5 LF-MCG/0.5 IM SUSP
0.5000 mL | Freq: Once | INTRAMUSCULAR | Status: AC
  Administered 2013-11-19: 0.5 mL via INTRAMUSCULAR
  Filled 2013-11-17: qty 0.5

## 2013-11-17 MED ORDER — LACTATED RINGERS IV SOLN
500.0000 mL | Freq: Once | INTRAVENOUS | Status: AC
  Administered 2013-11-17: 500 mL via INTRAVENOUS

## 2013-11-17 MED ORDER — KETOROLAC TROMETHAMINE 30 MG/ML IJ SOLN: 30.0000 mg | Freq: Four times a day (QID) | INTRAMUSCULAR | Status: AC | PRN

## 2013-11-17 MED ORDER — EPHEDRINE 5 MG/ML INJ
10.0000 mg | INTRAVENOUS | Status: DC | PRN
  Filled 2013-11-17: qty 4

## 2013-11-17 MED ORDER — DIPHENHYDRAMINE HCL 50 MG/ML IJ SOLN: 12.5000 mg | INTRAMUSCULAR | Status: DC | PRN

## 2013-11-17 MED ORDER — SCOPOLAMINE 1 MG/3DAYS TD PT72
MEDICATED_PATCH | TRANSDERMAL | Status: AC
  Filled 2013-11-17: qty 1

## 2013-11-17 MED ORDER — DIPHENHYDRAMINE HCL 50 MG/ML IJ SOLN: 25.0000 mg | INTRAMUSCULAR | Status: DC | PRN

## 2013-11-17 MED ORDER — EPHEDRINE 5 MG/ML INJ: 10.0000 mg | INTRAVENOUS | Status: DC | PRN

## 2013-11-17 MED ORDER — IBUPROFEN 600 MG PO TABS
600.0000 mg | ORAL_TABLET | Freq: Four times a day (QID) | ORAL | Status: DC
  Administered 2013-11-17 – 2013-11-19 (×7): 600 mg via ORAL
  Filled 2013-11-17 (×7): qty 1

## 2013-11-17 MED ORDER — MEPERIDINE HCL 25 MG/ML IJ SOLN: 6.2500 mg | INTRAMUSCULAR | Status: DC | PRN

## 2013-11-17 MED ORDER — SIMETHICONE 80 MG PO CHEW
80.0000 mg | CHEWABLE_TABLET | Freq: Three times a day (TID) | ORAL | Status: DC
  Administered 2013-11-18 – 2013-11-19 (×5): 80 mg via ORAL
  Filled 2013-11-17 (×5): qty 1

## 2013-11-17 MED ORDER — DIPHENHYDRAMINE HCL 25 MG PO CAPS
25.0000 mg | ORAL_CAPSULE | Freq: Four times a day (QID) | ORAL | Status: DC | PRN
  Administered 2013-11-18: 25 mg via ORAL
  Filled 2013-11-17: qty 1

## 2013-11-17 MED ORDER — PRENATAL MULTIVITAMIN CH
1.0000 | ORAL_TABLET | Freq: Every day | ORAL | Status: DC
  Administered 2013-11-18 – 2013-11-19 (×2): 1 via ORAL
  Filled 2013-11-17 (×2): qty 1

## 2013-11-17 MED ORDER — MEPERIDINE HCL 25 MG/ML IJ SOLN
INTRAMUSCULAR | Status: DC | PRN
  Administered 2013-11-17 (×2): 12.5 mg via INTRAVENOUS

## 2013-11-17 MED ORDER — NALOXONE HCL 0.4 MG/ML IJ SOLN: 0.4000 mg | INTRAMUSCULAR | Status: DC | PRN

## 2013-11-17 MED ORDER — LACTATED RINGERS IV SOLN
INTRAVENOUS | Status: DC
  Administered 2013-11-17: 22:00:00 via INTRAVENOUS

## 2013-11-17 MED ORDER — NALBUPHINE HCL 10 MG/ML IJ SOLN
5.0000 mg | INTRAMUSCULAR | Status: DC | PRN
  Filled 2013-11-17: qty 1

## 2013-11-17 MED ORDER — METOCLOPRAMIDE HCL 5 MG/ML IJ SOLN
INTRAMUSCULAR | Status: AC
  Administered 2013-11-17: 10 mg via INTRAVENOUS
  Filled 2013-11-17: qty 2

## 2013-11-17 MED ORDER — OXYTOCIN 40 UNITS IN LACTATED RINGERS INFUSION - SIMPLE MED
1.0000 m[IU]/min | INTRAVENOUS | Status: DC
  Administered 2013-11-17: 2 m[IU]/min via INTRAVENOUS
  Filled 2013-11-17: qty 1000

## 2013-11-17 MED ORDER — TERBUTALINE SULFATE 1 MG/ML IJ SOLN
INTRAMUSCULAR | Status: AC
  Filled 2013-11-17: qty 1

## 2013-11-17 MED ORDER — SIMETHICONE 80 MG PO CHEW
80.0000 mg | CHEWABLE_TABLET | ORAL | Status: DC
  Administered 2013-11-17 – 2013-11-19 (×2): 80 mg via ORAL
  Filled 2013-11-17 (×2): qty 1

## 2013-11-17 MED ORDER — PHENYLEPHRINE 8 MG IN D5W 100 ML (0.08MG/ML) PREMIX OPTIME
INJECTION | INTRAVENOUS | Status: DC | PRN
  Administered 2013-11-17: 40 ug/min via INTRAVENOUS

## 2013-11-17 MED ORDER — PHENYLEPHRINE 8 MG IN D5W 100 ML (0.08MG/ML) PREMIX OPTIME
INJECTION | INTRAVENOUS | Status: AC
  Filled 2013-11-17: qty 100

## 2013-11-17 MED ORDER — MEPERIDINE HCL 25 MG/ML IJ SOLN
INTRAMUSCULAR | Status: AC
  Filled 2013-11-17: qty 1

## 2013-11-17 MED ORDER — WITCH HAZEL-GLYCERIN EX PADS: 1.0000 "application " | MEDICATED_PAD | CUTANEOUS | Status: DC | PRN

## 2013-11-17 MED ORDER — KETOROLAC TROMETHAMINE 30 MG/ML IJ SOLN
30.0000 mg | Freq: Four times a day (QID) | INTRAMUSCULAR | Status: AC | PRN
  Administered 2013-11-17: 30 mg via INTRAMUSCULAR

## 2013-11-17 MED ORDER — FENTANYL 2.5 MCG/ML BUPIVACAINE 1/10 % EPIDURAL INFUSION (WH - ANES)
14.0000 mL/h | INTRAMUSCULAR | Status: DC | PRN
  Administered 2013-11-17: 14 mL/h via EPIDURAL
  Filled 2013-11-17: qty 125

## 2013-11-17 MED ORDER — SENNOSIDES-DOCUSATE SODIUM 8.6-50 MG PO TABS
2.0000 | ORAL_TABLET | ORAL | Status: DC
  Administered 2013-11-17 – 2013-11-19 (×2): 2 via ORAL
  Filled 2013-11-17 (×2): qty 2

## 2013-11-17 MED ORDER — DIPHENHYDRAMINE HCL 25 MG PO CAPS: 25.0000 mg | ORAL_CAPSULE | ORAL | Status: DC | PRN

## 2013-11-17 MED ORDER — METOCLOPRAMIDE HCL 5 MG/ML IJ SOLN
10.0000 mg | Freq: Three times a day (TID) | INTRAMUSCULAR | Status: DC | PRN
  Administered 2013-11-17: 10 mg via INTRAVENOUS

## 2013-11-17 MED ORDER — PHENYLEPHRINE 40 MCG/ML (10ML) SYRINGE FOR IV PUSH (FOR BLOOD PRESSURE SUPPORT)
80.0000 ug | PREFILLED_SYRINGE | INTRAVENOUS | Status: DC | PRN
  Administered 2013-11-17: 80 ug via INTRAVENOUS

## 2013-11-17 MED ORDER — LIDOCAINE HCL (PF) 1 % IJ SOLN
INTRAMUSCULAR | Status: DC | PRN
  Administered 2013-11-17 (×2): 4 mL

## 2013-11-17 MED ORDER — OXYTOCIN 10 UNIT/ML IJ SOLN
INTRAMUSCULAR | Status: AC
  Filled 2013-11-17: qty 4

## 2013-11-17 MED ORDER — CEFAZOLIN SODIUM-DEXTROSE 2-3 GM-% IV SOLR
INTRAVENOUS | Status: DC | PRN
  Administered 2013-11-17: 2 g via INTRAVENOUS

## 2013-11-17 MED ORDER — MENTHOL 3 MG MT LOZG: 1.0000 | LOZENGE | OROMUCOSAL | Status: DC | PRN

## 2013-11-17 MED ORDER — OXYTOCIN 40 UNITS IN LACTATED RINGERS INFUSION - SIMPLE MED: 62.5000 mL/h | INTRAVENOUS | Status: AC

## 2013-11-17 MED ORDER — SIMETHICONE 80 MG PO CHEW: 80.0000 mg | CHEWABLE_TABLET | ORAL | Status: DC | PRN

## 2013-11-17 MED ORDER — FENTANYL CITRATE 0.05 MG/ML IJ SOLN: 25.0000 ug | INTRAMUSCULAR | Status: DC | PRN

## 2013-11-17 MED ORDER — PHENYLEPHRINE 40 MCG/ML (10ML) SYRINGE FOR IV PUSH (FOR BLOOD PRESSURE SUPPORT)
80.0000 ug | PREFILLED_SYRINGE | INTRAVENOUS | Status: DC | PRN
  Filled 2013-11-17: qty 10

## 2013-11-17 MED ORDER — DIBUCAINE 1 % RE OINT: 1.0000 "application " | TOPICAL_OINTMENT | RECTAL | Status: DC | PRN

## 2013-11-17 MED ORDER — MORPHINE SULFATE 0.5 MG/ML IJ SOLN
INTRAMUSCULAR | Status: AC
  Filled 2013-11-17: qty 10

## 2013-11-17 MED ORDER — DEXTROSE 5 % IV SOLN: 1.0000 ug/kg/h | INTRAVENOUS | Status: DC | PRN

## 2013-11-17 MED ORDER — ONDANSETRON HCL 4 MG PO TABS: 4.0000 mg | ORAL_TABLET | ORAL | Status: DC | PRN

## 2013-11-17 MED ORDER — LACTATED RINGERS IV SOLN
INTRAVENOUS | Status: DC | PRN
  Administered 2013-11-17 (×2): via INTRAVENOUS

## 2013-11-17 MED ORDER — OXYCODONE-ACETAMINOPHEN 5-325 MG PO TABS: 1.0000 | ORAL_TABLET | ORAL | Status: DC | PRN

## 2013-11-17 MED ORDER — ONDANSETRON HCL 4 MG/2ML IJ SOLN: 4.0000 mg | INTRAMUSCULAR | Status: DC | PRN

## 2013-11-17 MED ORDER — SCOPOLAMINE 1 MG/3DAYS TD PT72
1.0000 | MEDICATED_PATCH | Freq: Once | TRANSDERMAL | Status: DC
  Administered 2013-11-17: 1.5 mg via TRANSDERMAL

## 2013-11-17 MED ORDER — ONDANSETRON HCL 4 MG/2ML IJ SOLN
INTRAMUSCULAR | Status: AC
  Filled 2013-11-17: qty 2

## 2013-11-17 MED ORDER — SODIUM CHLORIDE 0.9 % IJ SOLN: 3.0000 mL | INTRAMUSCULAR | Status: DC | PRN

## 2013-11-17 MED ORDER — MORPHINE SULFATE (PF) 0.5 MG/ML IJ SOLN
INTRAMUSCULAR | Status: DC | PRN
  Administered 2013-11-17: .5 mg via EPIDURAL
  Administered 2013-11-17: .5 mg via INTRAVENOUS
  Administered 2013-11-17: 4 mg via EPIDURAL

## 2013-11-17 MED ORDER — TERBUTALINE SULFATE 1 MG/ML IJ SOLN
0.2500 mg | Freq: Once | INTRAMUSCULAR | Status: AC
  Administered 2013-11-17: 0.25 mg via SUBCUTANEOUS

## 2013-11-17 MED ORDER — ZOLPIDEM TARTRATE 5 MG PO TABS: 5.0000 mg | ORAL_TABLET | Freq: Every evening | ORAL | Status: DC | PRN

## 2013-11-17 SURGICAL SUPPLY — 40 items
APL SKNCLS STERI-STRIP NONHPOA (GAUZE/BANDAGES/DRESSINGS) ×1
BENZOIN TINCTURE PRP APPL 2/3 (GAUZE/BANDAGES/DRESSINGS) ×3 IMPLANT
BLADE SURG 10 STRL SS (BLADE) ×6 IMPLANT
CLAMP CORD UMBIL (MISCELLANEOUS) IMPLANT
CLOSURE WOUND 1/2 X4 (GAUZE/BANDAGES/DRESSINGS) ×1
CLOTH BEACON ORANGE TIMEOUT ST (SAFETY) ×3 IMPLANT
DRAPE LG THREE QUARTER DISP (DRAPES) IMPLANT
DRSG OPSITE POSTOP 4X10 (GAUZE/BANDAGES/DRESSINGS) ×3 IMPLANT
DURAPREP 26ML APPLICATOR (WOUND CARE) ×3 IMPLANT
ELECT REM PT RETURN 9FT ADLT (ELECTROSURGICAL) ×3
ELECTRODE REM PT RTRN 9FT ADLT (ELECTROSURGICAL) ×1 IMPLANT
EXTRACTOR VACUUM BELL STYLE (SUCTIONS) IMPLANT
GLOVE BIO SURGEON STRL SZ 6.5 (GLOVE) ×2 IMPLANT
GLOVE BIO SURGEONS STRL SZ 6.5 (GLOVE) ×1
GLOVE BIOGEL PI IND STRL 7.0 (GLOVE) ×1 IMPLANT
GLOVE BIOGEL PI INDICATOR 7.0 (GLOVE) ×2
GOWN STRL REUS W/TWL LRG LVL3 (GOWN DISPOSABLE) ×9 IMPLANT
KIT ABG SYR 3ML LUER SLIP (SYRINGE) ×4 IMPLANT
NDL HYPO 25X5/8 SAFETYGLIDE (NEEDLE) IMPLANT
NEEDLE HYPO 25X5/8 SAFETYGLIDE (NEEDLE) ×6 IMPLANT
NS IRRIG 1000ML POUR BTL (IV SOLUTION) ×3 IMPLANT
PACK C SECTION WH (CUSTOM PROCEDURE TRAY) ×3 IMPLANT
PAD OB MATERNITY 4.3X12.25 (PERSONAL CARE ITEMS) ×3 IMPLANT
RETRACTOR WND ALEXIS 25 LRG (MISCELLANEOUS) ×1 IMPLANT
RTRCTR WOUND ALEXIS 25CM LRG (MISCELLANEOUS) ×3
STAPLER VISISTAT 35W (STAPLE) IMPLANT
STRIP CLOSURE SKIN 1/2X4 (GAUZE/BANDAGES/DRESSINGS) ×2 IMPLANT
SUT CHROMIC 2 0 CT 1 (SUTURE) ×3 IMPLANT
SUT CHROMIC 3 0 SH 27 (SUTURE) ×3 IMPLANT
SUT MNCRL 0 VIOLET CTX 36 (SUTURE) ×2 IMPLANT
SUT MONOCRYL 0 CTX 36 (SUTURE) ×4
SUT PDS AB 0 CTX 36 PDP370T (SUTURE) IMPLANT
SUT PLAIN 2 0 (SUTURE)
SUT PLAIN ABS 2-0 CT1 27XMFL (SUTURE) IMPLANT
SUT VIC AB 0 CT1 27 (SUTURE) ×3
SUT VIC AB 0 CT1 27XBRD ANBCTR (SUTURE) IMPLANT
SUT VIC AB 4-0 KS 27 (SUTURE) ×3 IMPLANT
TOWEL OR 17X24 6PK STRL BLUE (TOWEL DISPOSABLE) ×3 IMPLANT
TRAY FOLEY CATH 14FR (SET/KITS/TRAYS/PACK) ×1 IMPLANT
WATER STERILE IRR 1000ML POUR (IV SOLUTION) ×3 IMPLANT
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
## 2013-11-17 NOTE — Anesthesia Preprocedure Evaluation (Addendum)
Anesthesia Evaluation  Patient identified by MRN, date of birth, ID band Patient awake    Reviewed: Allergy & Precautions, H&P , Patient's Chart, lab work & pertinent test results  Airway Mallampati: III TM Distance: >3 FB Neck ROM: full    Dental no notable dental hx. (+) Teeth Intact   Pulmonary neg pulmonary ROS,  breath sounds clear to auscultation  Pulmonary exam normal       Cardiovascular negative cardio ROS  Rhythm:regular Rate:Normal     Neuro/Psych negative neurological ROS  negative psych ROS   GI/Hepatic negative GI ROS, Neg liver ROS,   Endo/Other  negative endocrine ROS  Renal/GU negative Renal ROS  negative genitourinary   Musculoskeletal   Abdominal Normal abdominal exam  (+)   Peds  Hematology negative hematology ROS (+)   Anesthesia Other Findings   Reproductive/Obstetrics (+) Pregnancy                           Anesthesia Physical Anesthesia Plan  ASA: II and emergent  Anesthesia Plan: Epidural   Post-op Pain Management:    Induction:   Airway Management Planned:   Additional Equipment:   Intra-op Plan:   Post-operative Plan:   Informed Consent: I have reviewed the patients History and Physical, chart, labs and discussed the procedure including the risks, benefits and alternatives for the proposed anesthesia with the patient or authorized representative who has indicated his/her understanding and acceptance.     Plan Discussed with: Anesthesiologist, CRNA and Surgeon  Anesthesia Plan Comments: (Patient for C/Section for fetal intolerance to labor. Will use epidural for C/Section. )       Anesthesia Quick Evaluation

## 2013-11-17 NOTE — Transfer of Care (Signed)
Immediate Anesthesia Transfer of Care Note  Patient: Lynn Wright  Procedure(s) Performed: Procedure(s): CESAREAN SECTION (N/A)  Patient Location: PACU  Anesthesia Type:Epidural  Level of Consciousness: awake, alert  and oriented  Airway & Oxygen Therapy: Patient Spontanous Breathing  Post-op Assessment: Report given to PACU RN and Post -op Vital signs reviewed and stable  Post vital signs: Reviewed and stable  Complications: No apparent anesthesia complications

## 2013-11-17 NOTE — Brief Op Note (Signed)
11/16/2013 - 11/17/2013  2:13 PM  PATIENT:  Lynn Wright  23 y.o. female  PRE-OPERATIVE DIAGNOSIS:  non reassuring fetal heart rate  POST-OPERATIVE DIAGNOSIS:  same  PROCEDURE:  Procedure(s): CESAREAN SECTION (N/A)  SURGEON:  Surgeon(s) and Role:    * Essie HartWalda Yates Weisgerber, MD - Primary    * Philip AspenSidney Callahan, DO - Assisting  PHYSICIAN ASSISTANT:   ASSISTANTSPhilip Aspen: CALLAHAN, SIDNEY   ANESTHESIA:   epidural  EBL:  Total I/O In: 2600 [I.V.:2600] Out: 1150 [Urine:350; Blood:800]  BLOOD ADMINISTERED:none  DRAINS: Urinary Catheter (Foley)   LOCAL MEDICATIONS USED:  NONE  SPECIMEN:  Source of Specimen:  Placenta  DISPOSITION OF SPECIMEN:  PATHOLOGY  COUNTS:  YES  TOURNIQUET:  * No tourniquets in log *  DICTATION: .Note written in EPIC  PLAN OF CARE: Admit to inpatient   PATIENT DISPOSITION:  PACU - hemodynamically stable.   Delay start of Pharmacological VTE agent (>24hrs) due to surgical blood loss or risk of bleeding: not applicable  Josedaniel Haye [name]

## 2013-11-17 NOTE — Lactation Note (Signed)
This note was copied from the chart of Lynn Wright. Lactation Consultation Note  P1, Infant sleeping on FOB's chest.  Reminded parents not to fall asleep with the baby in their arms for safety.  Parents acknowledged they understood. Reviewed hand expression with mother.  Small drops of colostrum expressed. Mother concerned because infant is sleepy.  Reviewed that infant's are sleeping in the few hours after birth and encouraged STS. Reviewed feeding cues, size of infant's stomach, cluster feeding and Baby & Me booklet pp 20-24. Mom made aware of O/P services, breastfeeding support groups, community resources, and our phone # for post-discharge questions.    Patient Name: Lynn Rubye Beachamara Zipp UJWJX'BToday's Date: 11/17/2013 Reason for consult: Initial assessment   Maternal Data Has patient been taught Hand Expression?: Yes Does the patient have breastfeeding experience prior to this delivery?: No  Feeding Feeding Type: Breast Fed Length of feed: 5 min  LATCH Score/Interventions                      Lactation Tools Discussed/Used     Consult Status Consult Status: Follow-up Date: 11/18/13 Follow-up type: In-patient    Dahlia ByesBerkelhammer, [facility] HospitalBoschen 11/17/2013, 7:51 PM

## 2013-11-17 NOTE — Op Note (Signed)
Cesarean Section Procedure Note  Indications: Non-reassuring fetal heart tracing remote from delivery  Pre-operative Diagnosis: 41 week 2 day pregnancy, Non-reassuring fetal heart tracing  Post-operative Diagnosis: same  Surgeon: Caffie Damme   Assistants: Allyn Kenner  Anesthesia: Spinal anesthesia  ASA Class: 2  Procedure Details   The patient was seen in the Holding Room. The risks, benefits, complications, treatment options, and expected outcomes were discussed with the patient.  The patient concurred with the proposed plan, giving informed consent.  The site of surgery properly noted/marked. The patient was taken to Operating Room # 9, identified as Kenda Kloehn and the procedure verified as C-Section Delivery. A Time Out was held and the above information confirmed.  After induction of anesthesia via already placed epidural,the patient was placed in the dorsal supine position with a leftward tilt, draped and prepped in the usual sterile manner. Confirmation of anesthesia adequacy confirmed and a Pfannenstiel incision was made and carried down through the subcutaneous tissue to the fascia.  The fascia was incised in the midline and the fascial incision was extended laterally with Mayo scissors. The superior aspect of the fascial incision was grasped with Coker clamps x2, tented up and the rectus muscles dissected off sharply with Mayo scissors. The rectus was then dissected off with blunt dissection and Mayo scissors inferiorly. The rectus muscles were separated in the midline. The abdominal peritoneum was identified, and entered bluntly using surgeon fingers and the incision was extended by stretching. The Alexis retractor was then deployed. The vesicouterine peritoneum was identified, tented up, entered sharply with Metzenbaum scissors, and the bladder flap was created digitally. Scalpel was then used to make a low transverse incision on the uterus which was extended laterally  with  blunt dissection. Thin meconium noted. The fetal vertex was identified, delivered easily through the uterine incision followed by the body. The A live female infant was bulb suctioned on the operative field cried vigorously, cord was clamped and cut and the infant was passed to the waiting neonatologist. Apgars 9/9. Cord gases and cord blood obtained. Placenta was then delivered spontaneously, intact and appear normal, the uterus was cleared of all clot and debris. The uterine incision was repaired with #1 Monocryl in running locked fashion. A second imbricating suture was performed. The uterine incision was hemostatic. Ovaries and tubes were inspected and normal. The Alexis retractor was removed. The uterus was returned to the abdominal cavity the abdominal cavity was cleared of all clot and debris. The abdominal peritoneum was reapproximated with 2-0 chromic in a running fashion, the rectus muscles was reapproximated with #2 chromic in interrupted fashion. The fascia was closed with 0-vicryl in a running fashion from both ends of fascia,  Met in the middle. The skin was closed with 4-0 vicryl in a subcuticular fashion and dressed with benzoine, steri strips and pressure dressing. All sponge lap and needle counts were correct x2. Patient tolerated the procedure well and recovered in stable condition following the procedure.  Instrument, sponge, and needle counts were correct prior the abdominal closure and at the conclusion of the case.   Findings: Live female infant, Apgars 9/9, thin meconium, normal appearing placenta, normal uterus, bilateral tubes and ovaries  Estimated Blood Loss:  878m         Drains: Foley catheter                 Specimens: Placenta cord gases          Implants: none  Complications:  None; patient tolerated the procedure well.         Disposition: PACU - hemodynamically stable.  Madai Nuccio [name]

## 2013-11-17 NOTE — Progress Notes (Signed)
Lynn Wright is a 23 y.o. G1P0 at 5625w2d by LMP admitted for induction of labor due to Post dates.  Subjective: Patient comfortable with epidural  Objective: BP 108/68  Pulse 104  Temp(Src) 98.5 F (36.9 C) (Oral)  Resp 16  Ht 4\' 11"  (1.499 m)  Wt 73.936 kg (163 lb)  BMI 32.90 kg/m2  SpO2 100%  LMP 02/01/2013      FHT:  FHR: 130 bpm, variability: moderate,  accelerations:  Absent,  decelerations:  Present late decelerations UC:   regular, every 2 minutes SVE:   Dilation: 5 Effacement (%): 80 Station: -1 Exam by:: lee  Labs: Lab Results  Component Value Date   WBC 9.3 11/16/2013   HGB 11.0* 11/16/2013   HCT 33.3* 11/16/2013   MCV 76.9* 11/16/2013   PLT 298 11/16/2013    Assessment / Plan: Induction of labor due to postterm,  Pitocin was stopped due to repetitive late decelerations. FHT now recovered no decels.  Patient counseled about recommendation to proceed with primary cesarean delivery due to Cat III tracing   Lynn Wright Lynn Wright 11/17/2013, 12:24 PM

## 2013-11-17 NOTE — Anesthesia Postprocedure Evaluation (Signed)
Anesthesia Post Note  Patient: Lynn Wright  Procedure(s) Performed: Procedure(s) (LRB): CESAREAN SECTION (N/A)  Anesthesia type: Epidural  Patient location: PACU  Post pain: Pain level controlled  Post assessment: Post-op Vital signs reviewed  Last Vitals:  Filed Vitals:   11/17/13 1500  BP:   Pulse: 95  Temp: 37.3 C  Resp: 19    Post vital signs: Reviewed  Level of consciousness: awake  Complications: No apparent anesthesia complications

## 2013-11-17 NOTE — Progress Notes (Signed)
Jayle L. Perlie GoldRussell is a 23 y.o. G1P0 at 1139w2d by LMP admitted for induction of labor due to Post dates. At 41 weeks 2 days Subjective: Patient feeling more contraction pain  Objective: BP 133/77  Pulse 104  Temp(Src) 98.2 F (36.8 C) (Oral)  Resp 18  Ht 4\' 11"  (1.499 m)  Wt 73.936 kg (163 lb)  BMI 32.90 kg/m2  SpO2 100%  LMP 02/01/2013      FHT:  FHR: 130 bpm, variability: moderate,  accelerations:  Present,  decelerations:  Present intermittent late decelerations, recent late deceleration nadir 80s lasting 45 sec with return to baseline UC:   regular, every 1-2 minutes SVE:   Dilation: 3 Effacement (%): 80 Station: -2 Exam by:: Nollie Terlizzi  Labs: Lab Results  Component Value Date   WBC 9.3 11/16/2013   HGB 11.0* 11/16/2013   HCT 33.3* 11/16/2013   MCV 76.9* 11/16/2013   PLT 298 11/16/2013    Assessment / Plan: Induction of labor due to postterm,  progressing well on pitocin Reviewed FHT from overnight, baseline remains at 130-135 moderate variability accels several episodes  Of subtle late decelerations and recent late deceleration nadir 80's return to baseline with maternal re-positioning.  AROM performed clear fluid, FSE placed.  We discussed FHT and implications for delivery, will continue induction as planned, will see if she progresses  With AROM and if not will add pitocin for augmentation.  Epidural now for pain.    Essie HartINN, Ariyona Eid [name] 11/17/2013, 7:58 AM

## 2013-11-17 NOTE — Anesthesia Procedure Notes (Signed)
Epidural Patient location during procedure: OB Start time: 11/17/2013 8:44 AM  Staffing Anesthesiologist: Prince Couey A. Performed by: anesthesiologist   Preanesthetic Checklist Completed: patient identified, site marked, surgical consent, pre-op evaluation, timeout performed, IV checked, risks and benefits discussed and monitors and equipment checked  Epidural Patient position: sitting Prep: site prepped and draped and DuraPrep Patient monitoring: continuous pulse ox and blood pressure Approach: midline Location: L4-L5 Injection technique: LOR air  Needle:  Needle type: Tuohy  Needle gauge: 17 G Needle length: 9 cm and 9 Needle insertion depth: 5 cm cm Catheter type: closed end flexible Catheter size: 19 Gauge Catheter at skin depth: 10 cm Test dose: negative and Other  Assessment Events: blood not aspirated, injection not painful, no injection resistance, negative IV test and no paresthesia  Additional Notes Patient identified. Risks and benefits discussed including failed block, incomplete  Pain control, post dural puncture headache, nerve damage, paralysis, blood pressure Changes, nausea, vomiting, reactions to medications-both toxic and allergic and post Partum back pain. All questions were answered. Patient expressed understanding and wished to proceed. Sterile technique was used throughout procedure. Epidural site was Dressed with sterile barrier dressing. No paresthesias, signs of intravascular injection Or signs of intrathecal spread were encountered.  Patient was more comfortable after the epidural was dosed. Please see RN's note for documentation of vital signs and FHR which are stable.

## 2013-11-18 ENCOUNTER — Encounter (HOSPITAL_COMMUNITY): Payer: Self-pay | Admitting: Obstetrics & Gynecology

## 2013-11-18 LAB — CBC
HCT: 26.7 % — ABNORMAL LOW (ref 36.0–46.0)
HEMOGLOBIN: 8.6 g/dL — AB (ref 12.0–15.0)
MCH: 24.6 pg — ABNORMAL LOW (ref 26.0–34.0)
MCHC: 32.2 g/dL (ref 30.0–36.0)
MCV: 76.5 fL — ABNORMAL LOW (ref 78.0–100.0)
Platelets: 211 10*3/uL (ref 150–400)
RBC: 3.49 MIL/uL — AB (ref 3.87–5.11)
RDW: 15.9 % — ABNORMAL HIGH (ref 11.5–15.5)
WBC: 9.7 10*3/uL (ref 4.0–10.5)

## 2013-11-18 NOTE — Lactation Note (Signed)
This note was copied from the chart of Lynn Rubye Beachamara Stephanie. Lactation Consultation Note Assisted mom with latch- baby doing some sucking on her tongue Took several attempts then latched pretty well. Mom needs much assist with latch. Reviewed basic teaching again. Encouraged to watch for feeding cues and feed whenever she sees them.No questions at present. To call for assist prn  Patient Name: Lynn Wright JWJXB'JToday's Date: 11/18/2013 Reason for consult: Follow-up assessment   Maternal Data Formula Feeding for Exclusion: No  Feeding Feeding Type: Breast Fed Length of feed: 20 min  LATCH Score/Interventions Latch: Repeated attempts needed to sustain latch, nipple held in mouth throughout feeding, stimulation needed to elicit sucking reflex. Intervention(s): Assist with latch  Audible Swallowing: A few with stimulation  Type of Nipple: Everted at rest and after stimulation (erect with stimulation)  Comfort (Breast/Nipple): Soft / non-tender     Hold (Positioning): Assistance needed to correctly position infant at breast and maintain latch.  LATCH Score: 7  Lactation Tools Discussed/Used     Consult Status Consult Status: Follow-up Date: 11/19/13 Follow-up type: In-patient    Pamelia HoitWeeks, Clemon Devaul D 11/18/2013, 3:42 PM

## 2013-11-18 NOTE — Anesthesia Postprocedure Evaluation (Signed)
Anesthesia Post Note  Patient: Lynn Wright  Procedure(s) Performed: Procedure(s) (LRB): CESAREAN SECTION (N/A)  Anesthesia type: Epidural  Patient location: Mother/Baby  Post pain: Pain level controlled  Post assessment: Post-op Vital signs reviewed  Last Vitals:  Filed Vitals:   11/18/13 0430  BP: 126/68  Pulse: 114  Temp: 37.1 C  Resp: 20    Post vital signs: Reviewed  Level of consciousness:alert  Complications: No apparent anesthesia complications
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
## 2013-11-18 NOTE — Progress Notes (Addendum)
  Patient is eating, ambulating, just had cath out.  Pain control is good.  Filed Vitals:   11/17/13 2034 11/17/13 2230 11/18/13 0030 11/18/13 0430  BP: 128/71 131/75 121/71 126/68  Pulse: 115 99 111 114  Temp:  98.1 F (36.7 C) 98.5 F (36.9 C) 98.7 F (37.1 C)  TempSrc:  Oral Oral Oral  Resp:  20 20 20   Height:      Weight:      SpO2:  98% 95% 98%    lungs:   clear to auscultation cor:    RRR Abdomen:  soft, appropriate tenderness, incisions intact and without erythema or exudate ex:    no cords   Lab Results  Component Value Date   WBC 9.7 11/18/2013   HGB 8.6* 11/18/2013   HCT 26.7* 11/18/2013   MCV 76.5* 11/18/2013   PLT 211 11/18/2013    --/--/O POS (08/09 2015)/RI  A/P    Post operative day 1.  Routine post op and postpartum care.  Expect d/c tomorrow.  Percocet for pain control.

## 2013-11-18 NOTE — Addendum Note (Signed)
Addendum created 11/18/13 0755 by Graciela HusbandsWynn O Demaris Leavell, CRNA   Modules edited: Charges VN, Notes Section   Notes Section:  File: [id number]

## 2013-11-18 NOTE — Lactation Note (Signed)
This note was copied from the chart of Lynn Rubye Beachamara Fouty. Lactation Consultation Note; Follow up visit with mom. Baby now 3821 hours old. Mom reports that baby is latching well with no pain but is fussy sometimes so she is not sure if baby is getting enough. Reviewed signs to know baby is getting enough. Several voids/stools. Baby asleep in bassinet at present. Encouraged mom to page for assist at next feeding. No questions at present.   Patient Name: Lynn Wright HYQMV'HToday's Date: 11/18/2013 Reason for consult: Follow-up assessment   Maternal Data    Feeding Feeding Type: Breast Fed  LATCH Score/Interventions Latch: Repeated attempts needed to sustain latch, nipple held in mouth throughout feeding, stimulation needed to elicit sucking reflex.  Audible Swallowing: A few with stimulation  Type of Nipple: Everted at rest and after stimulation  Comfort (Breast/Nipple): Soft / non-tender     Hold (Positioning): Assistance needed to correctly position infant at breast and maintain latch.  LATCH Score: 7  Lactation Tools Discussed/Used     Consult Status Consult Status: Follow-up Date: 11/18/13 Follow-up type: In-patient    Pamelia HoitWeeks, Davante Gerke D 11/18/2013, 10:59 AM

## 2013-11-19 MED ORDER — OXYCODONE-ACETAMINOPHEN 5-325 MG PO TABS: 1.0000 | ORAL_TABLET | ORAL | Status: DC | PRN

## 2013-11-19 NOTE — Discharge Summary (Signed)
Obstetric Discharge Summary Reason for Admission: induction of labor Prenatal Procedures: ultrasound Intrapartum Procedures: cesarean: low cervical, transverse Postpartum Procedures: none Complications-Operative and Postpartum: none Hemoglobin  Date Value Ref Range Status  11/18/2013 8.6* 12.0 - 15.0 g/dL Final     DELTA CHECK NOTED     REPEATED TO VERIFY     HCT  Date Value Ref Range Status  11/18/2013 26.7* 36.0 - 46.0 % Final    Physical Exam:  General: alert and cooperative Lochia: appropriate Uterine Fundus: firm Incision: healing well, no significant drainage, no significant erythema DVT Evaluation: No evidence of DVT seen on physical exam.  Discharge Diagnoses: Term Pregnancy-delivered  Discharge Information: Date: 11/19/2013 Activity: pelvic rest Diet: routine Medications: PNV, Ibuprofen, Colace, Iron and Percocet Condition: stable Instructions: refer to practice specific booklet Discharge to: home Follow-up Information   Follow up with PINN, Sanjuana MaeWALDA [name], MD In 2 weeks.   Specialty:  Obstetrics and Gynecology   Contact information:   [address] [place] [address] [phone]       Newborn Data: Live born female  Birth Weight: 6 lb 10.3 oz (3014 g) APGAR: 9, 9  Home with mother.  Philip AspenCALLAHAN, Kiegan Macaraeg 11/19/2013, 10:27 AM

## 2013-11-20 ENCOUNTER — Ambulatory Visit: Payer: Self-pay

## 2013-11-20 NOTE — Lactation Note (Signed)
This note was copied from the chart of Lynn Wright. Lactation Consultation Note  Patient Name: Lynn Wright ZOXWR'UToday's Date: 11/20/2013 Reason for consult: Follow-up assessment  Mom has some nipple tenderness, positional stripes bilateral. Her breasts are filling, firm but not yet engorged. Engorgement care reviewed. Advised to refer to page 24 Baby N Me Booklet. Mom is pumping and supplementing with EBM. Advised Mom breasts need to be emptied every 2-3 hours to prevent engorgement and protect milk supply. Advised to BF each feeding before giving any bottles. Care for sore nipples reviewed, comfort gels given with instructions. Assisted Mom with positioning and obtaining more depth with latch at this visit. Mom reports less discomfort. Advised of OP services and support group.  Maternal Data    Feeding Feeding Type: Breast Fed Length of feed: 10 min  LATCH Score/Interventions Latch: Grasps breast easily, tongue down, lips flanged, rhythmical sucking. Intervention(s): Assist with latch;Breast massage;Breast compression;Adjust position (to obtain more depth w/latch)  Audible Swallowing: A few with stimulation  Type of Nipple: Everted at rest and after stimulation  Comfort (Breast/Nipple): Filling, red/small blisters or bruises, mild/mod discomfort  Problem noted: Filling;Mild/Moderate discomfort;Cracked, bleeding, blisters, bruises Interventions  (Cracked/bleeding/bruising/blister): Expressed breast milk to nipple Interventions (Mild/moderate discomfort): Comfort gels  Hold (Positioning): Assistance needed to correctly position infant at breast and maintain latch. Intervention(s): Breastfeeding basics reviewed;Support Pillows;Position options;Skin to skin  LATCH Score: 7  Lactation Tools Discussed/Used Tools: Comfort gels   Consult Status Consult Status: Complete Date: 11/20/13 Follow-up type: In-patient    Alfred LevinsGranger, Nashton Belson Ann 11/20/2013, 9:32 AM

## 2013-12-18 ENCOUNTER — Encounter (HOSPITAL_COMMUNITY): Payer: Self-pay | Admitting: Emergency Medicine

## 2013-12-18 ENCOUNTER — Emergency Department (HOSPITAL_COMMUNITY)
Admission: EM | Admit: 2013-12-18 | Discharge: 2013-12-18 | Discharge status: Against Medical Advice | Payer: Medicaid Other | Attending: Emergency Medicine | Admitting: Emergency Medicine

## 2013-12-18 DIAGNOSIS — R112 Nausea with vomiting, unspecified: Secondary | ICD-10-CM | POA: Insufficient documentation

## 2013-12-18 DIAGNOSIS — R1013 Epigastric pain: Secondary | ICD-10-CM | POA: Diagnosis present

## 2013-12-18 MED ORDER — ONDANSETRON HCL 4 MG/2ML IJ SOLN: 4.0000 mg | Freq: Once | INTRAMUSCULAR | Status: DC

## 2013-12-18 NOTE — ED Notes (Signed)
Pt decided to go home at this time; states,"I feel so much better". LWBS after triage.
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
## 2014-01-11 ENCOUNTER — Emergency Department (HOSPITAL_COMMUNITY)
Admission: EM | Admit: 2014-01-11 | Discharge: 2014-01-11 | Disposition: A | Discharge status: Home / Self Care | Payer: Medicaid Other | Attending: Emergency Medicine | Admitting: Emergency Medicine

## 2014-01-11 ENCOUNTER — Encounter (HOSPITAL_COMMUNITY): Payer: Self-pay | Admitting: Emergency Medicine

## 2014-01-11 DIAGNOSIS — L03031 Cellulitis of right toe: Secondary | ICD-10-CM

## 2014-01-11 DIAGNOSIS — Z79899 Other long term (current) drug therapy: Secondary | ICD-10-CM | POA: Diagnosis not present

## 2014-01-11 MED ORDER — BACITRACIN 500 UNIT/GM EX OINT
1.0000 "application " | TOPICAL_OINTMENT | Freq: Once | CUTANEOUS | Status: AC
  Administered 2014-01-11: 1 via TOPICAL
  Filled 2014-01-11: qty 0.9

## 2014-01-11 NOTE — Discharge Instructions (Signed)
Use warm soaks to your toe.  Paronychia Paronychia is an inflammatory reaction involving the folds of the skin surrounding the fingernail. This is commonly caused by an infection in the skin around a nail. The most common cause of paronychia is frequent wetting of the hands (as seen with bartenders, food servers, nurses or others who wet their hands). This makes the skin around the fingernail susceptible to infection by bacteria (germs) or fungus. Other predisposing factors are:  Aggressive manicuring.  Nail biting.  Thumb sucking. The most common cause is a staphylococcal (a type of germ) infection, or a fungal (Candida) infection. When caused by a germ, it usually comes on suddenly with redness, swelling, pus and is often painful. It may get under the nail and form an abscess (collection of pus), or form an abscess around the nail. If the nail itself is infected with a fungus, the treatment is usually prolonged and may require oral medicine for up to one year. Your caregiver will determine the length of time treatment is required. The paronychia caused by bacteria (germs) may largely be avoided by not pulling on hangnails or picking at cuticles. When the infection occurs at the tips of the finger it is called felon. When the cause of paronychia is from the herpes simplex virus (HSV) it is called herpetic whitlow. TREATMENT  When an abscess is present treatment is often incision and drainage. This means that the abscess must be cut open so the pus can get out. When this is done, the following home care instructions should be followed. HOME CARE INSTRUCTIONS   It is important to keep the affected fingers very dry. Rubber or plastic gloves over cotton gloves should be used whenever the hand must be placed in water.  Keep wound clean, dry and dressed as suggested by your caregiver between warm soaks or warm compresses.  Soak in warm water for fifteen to twenty minutes three to four times per day for  bacterial infections. Fungal infections are very difficult to treat, so often require treatment for long periods of time.  For bacterial (germ) infections take antibiotics (medicine which kill germs) as directed and finish the prescription, even if the problem appears to be solved before the medicine is gone.  Only take over-the-counter or prescription medicines for pain, discomfort, or fever as directed by your caregiver. SEEK IMMEDIATE MEDICAL CARE IF:  You have redness, swelling, or increasing pain in the wound.  You notice pus coming from the wound.  You have a fever.  You notice a bad smell coming from the wound or dressing. Document Released: 09/20/2000 Document Revised: 06/19/2011 Document Reviewed: 05/22/2008 [facility] Patient Information 2015 [place], [place]. This information is not intended to replace advice given to you by your health care provider. Make sure you discuss any questions you have with your health care provider.

## 2014-01-11 NOTE — ED Notes (Signed)
PT is A/o and compains of toe pain on Rt foot. Pt is poor historian at this time Pt does not know whe pain in toe started or what may have caused the pain in toe .

## 2014-01-11 NOTE — ED Provider Notes (Signed)
CSN: [id number]     Arrival date & time 01/11/14  [id number] History  This chart was scribed for non-physician practitioner, Johnnette Gourdobyn Albert, PA-C,working with Flint MelterElliott L Wentz, MD, by Karle PlumberJennifer Tensley, ED Scribe. This patient was seen in room TR06C/TR06C and the patient's care was started at 9:32 AM.  Chief Complaint  Patient presents with  . Toe Injury   The history is provided by the patient. No language interpreter was used.   HPI Comments:  Lynn Wright is a 23 y.o. female who presents to the Emergency Department complaining of right third toe pain that began a few days ago. She states it began with a bruise secondary to new shoes. She now reports increased throbbing pain and drainage from the toe. Touching the area makes the pain worse. She denies any alleviating factors. She tried soaking in warm water yesterday with no relief. Pt denies fever or chills.  History reviewed. No pertinent past medical history. Past Surgical History  Procedure Laterality Date  . Cesarean section N/A 11/17/2013    Procedure: CESAREAN SECTION;  Surgeon: Essie HartWalda Pinn, MD;  Location: [facility];  Service: Obstetrics;  Laterality: N/A;   History reviewed. No pertinent family history. History  Substance Use Topics  . Smoking status: Never Smoker   . Smokeless tobacco: Not on file  . Alcohol Use: No   OB History   Grav Para Term Preterm Abortions TAB SAB Ect Mult Living   1 1 1       1      Review of Systems  Constitutional: Negative for fever and chills.  Skin: Positive for wound.  All other systems reviewed and are negative.   Allergies  Review of patient's allergies indicates no known allergies.  Home Medications   Prior to Admission medications   Medication Sig Start Date End Date Taking? Authorizing Provider  oxyCODONE-acetaminophen (PERCOCET/ROXICET) 5-325 MG per tablet Take 1-2 tablets by mouth every 4 (four) hours as needed for severe pain (moderate - severe pain). 11/19/13   Philip AspenSidney Callahan, DO   Prenatal Vit-Fe Fumarate-FA (PRENATAL MULTIVITAMIN) TABS tablet Take 1 tablet by mouth daily at 12 noon.    Historical Provider, MD   Triage Vitals: Pulse 90  Temp(Src) 97.4 F (36.3 C) (Oral)  Resp 12  Ht 4\' 11"  (1.499 m)  Wt 163 lb (73.936 kg)  BMI 32.90 kg/m2  SpO2 100% Physical Exam  Nursing note and vitals reviewed. Constitutional: She is oriented to person, place, and time. She appears well-developed and well-nourished. No distress.  HENT:  Head: Normocephalic and atraumatic.  Mouth/Throat: Oropharynx is clear and moist.  Eyes: Conjunctivae and EOM are normal.  Neck: Normal range of motion. Neck supple.  Cardiovascular: Normal rate, regular rhythm and normal heart sounds.   Pulmonary/Chest: Effort normal and breath sounds normal. No respiratory distress.  Musculoskeletal: Normal range of motion. She exhibits no edema.  Neurological: She is alert and oriented to person, place, and time. No sensory deficit.  Skin: Skin is warm and dry.  1 cm area of fluctuance with small amount of drainage under right middle toe. No surrounding cellulitis.  Psychiatric: She has a normal mood and affect. Her behavior is normal.    ED Course  Procedures (including critical care time) DIAGNOSTIC STUDIES: Oxygen Saturation is 100% on RA, normal by my interpretation.   COORDINATION OF CARE: 9:37 AM- Will I & D paronychia. Pt verbalizes understanding and agrees to plan.  INCISION AND DRAINAGE PROCEDURE NOTE: Patient identification was confirmed and verbal consent  was obtained. This procedure was performed by Johnnette Gourd, PA-C at 9:38 AM. Site: right third toe Sterile procedures observed Needle size: none Anesthetic used (type and amt): none Blade size: 15 Drainage: moderate Complexity: simple Packing used: none Incision made over site, wound drained and explored loculations, rinsed with copious amounts of normal saline, wound covered with dry, sterile dressing.  Pt tolerated procedure  well without complications.  Instructions for care discussed verbally and pt provided with additional written instructions for homecare and f/u.  Medications  bacitracin ointment 1 application (not administered)    Labs Review Labs Reviewed - No data to display  Imaging Review No results found.   EKG Interpretation None      MDM   Final diagnoses:  Paronychia of third toe of right foot   Paronychia drained. No cellulitis. Advised warm soaks. Stable for d/c. Return precautions given. Patient states understanding of treatment care plan and is agreeable.  I personally performed the services described in this documentation, which was scribed in my presence. The recorded information has been reviewed and is accurate.    Lynn Mace, PA-C 01/11/14 [phone]

## 2014-01-11 NOTE — ED Notes (Signed)
Declined W/C at D/C and was escorted to lobby by RN. 

## 2014-01-11 NOTE — ED Provider Notes (Signed)
Medical screening examination/treatment/procedure(s) were performed by non-physician practitioner and as supervising physician I was immediately available for consultation/collaboration.  Tifanie Gardiner L Laruen Risser, MD 01/11/14 1518 

## 2014-02-09 ENCOUNTER — Encounter (HOSPITAL_COMMUNITY): Payer: Self-pay | Admitting: Emergency Medicine

## 2015-02-18 IMAGING — US US OB COMP LESS 14 WK
1 series · 14 of 28 positions shown · non-contrast
Comparison: None.

CLINICAL DATA: Abdominal cramping. Eight weeks and 3 days pregnant
by last menstrual period. Quantitative beta HCG pending.

EXAM:
OBSTETRIC <14 WK ULTRASOUND
TECHNIQUE: Transabdominal ultrasound was performed for evaluation of the
gestation as well as the maternal uterus and adnexal regions.

[Series 1: us ob comp less 14 wks · 14 of 28 slices shown]
[im 2/28]
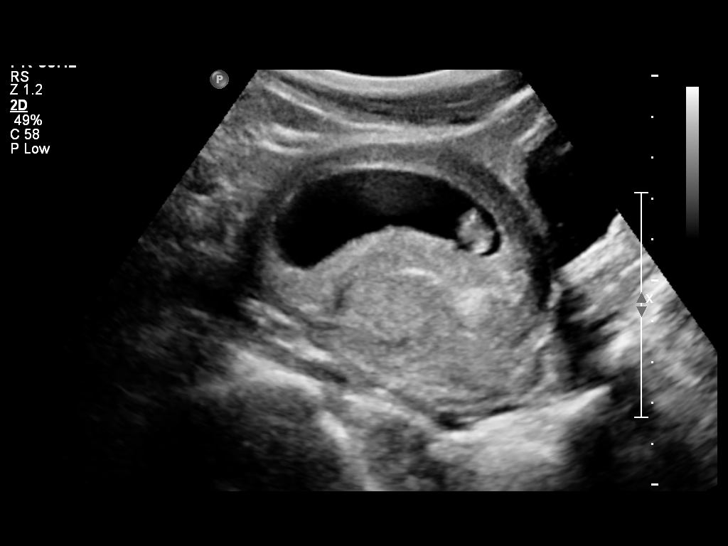
[im 4/28]
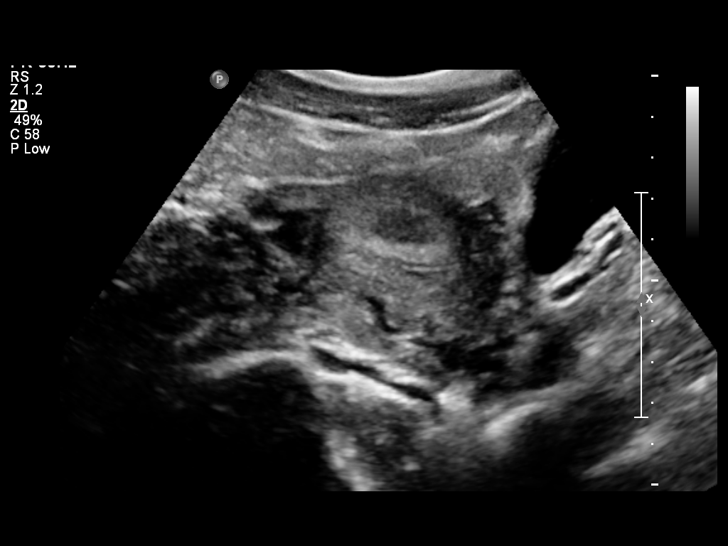
[im 6/28]
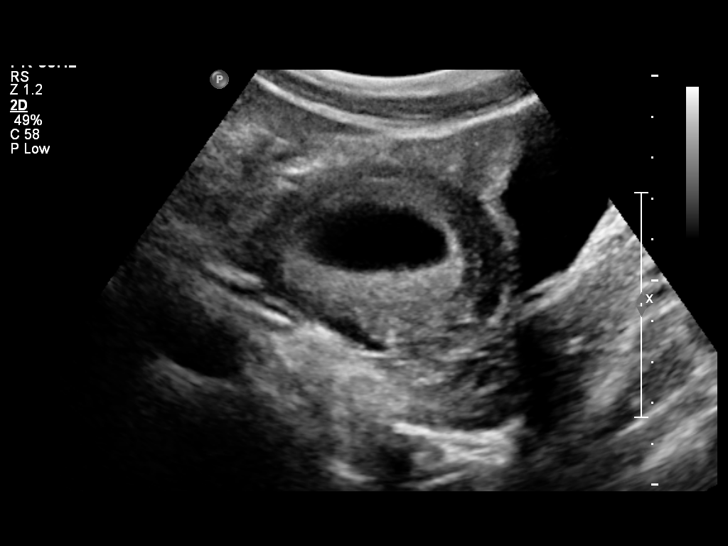
[im 8/28]
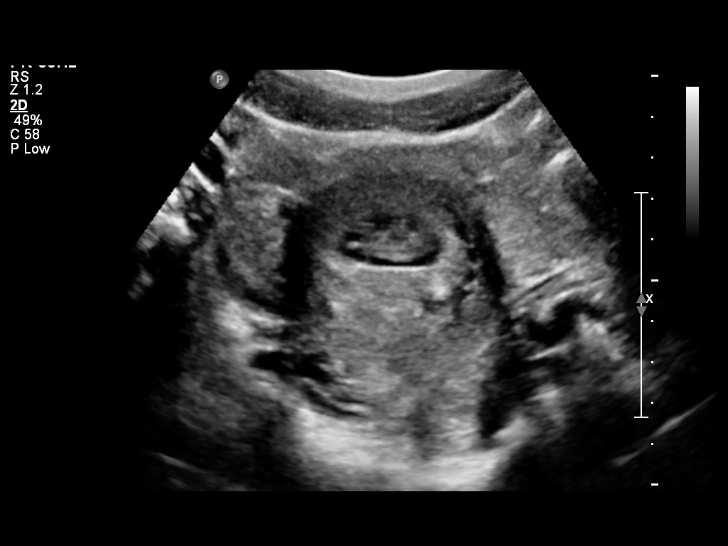
[im 10/28]
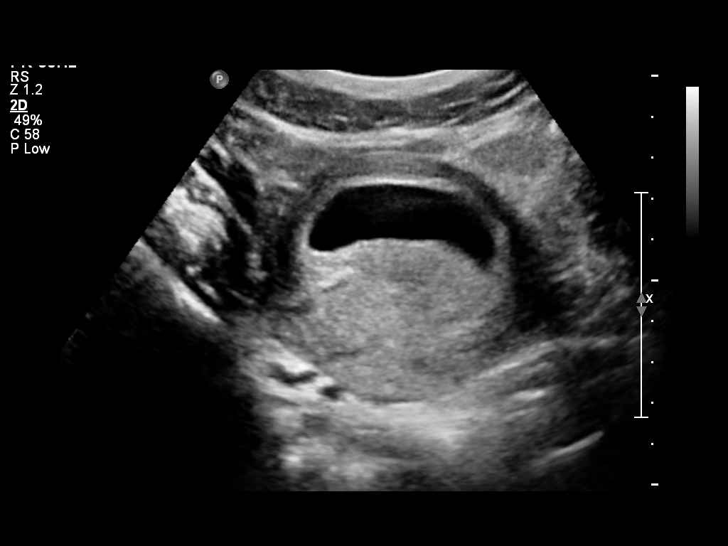
[im 12/28]
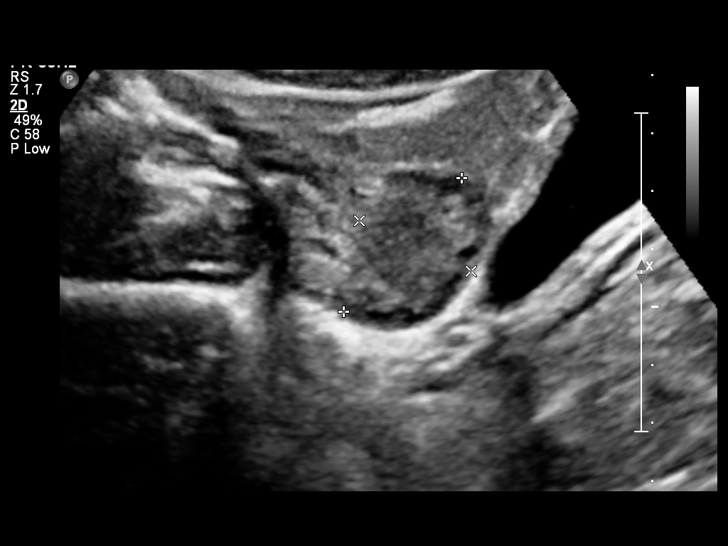
[im 14/28]
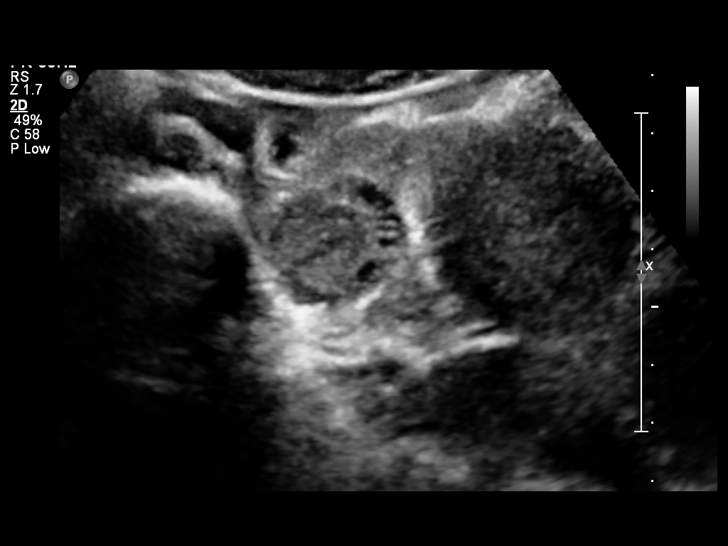
[im 16/28]
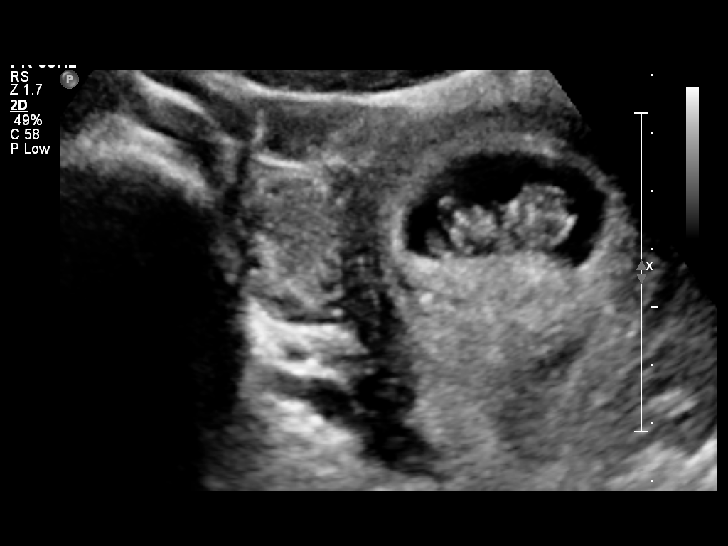
[im 18/28]
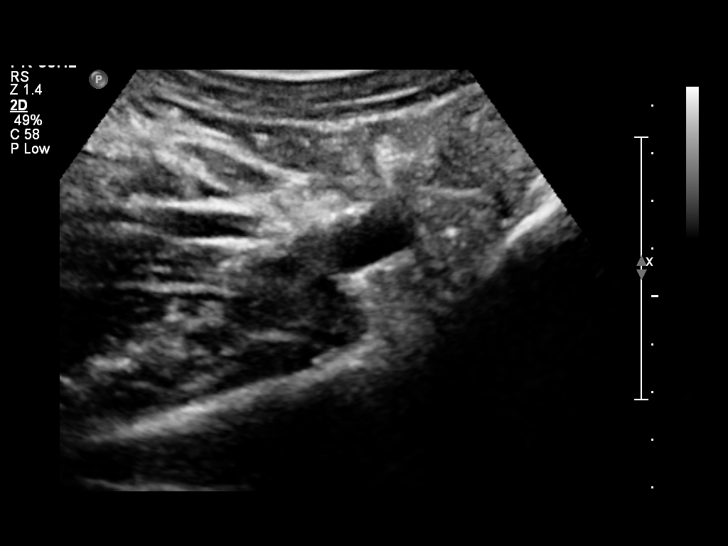
[im 20/28]
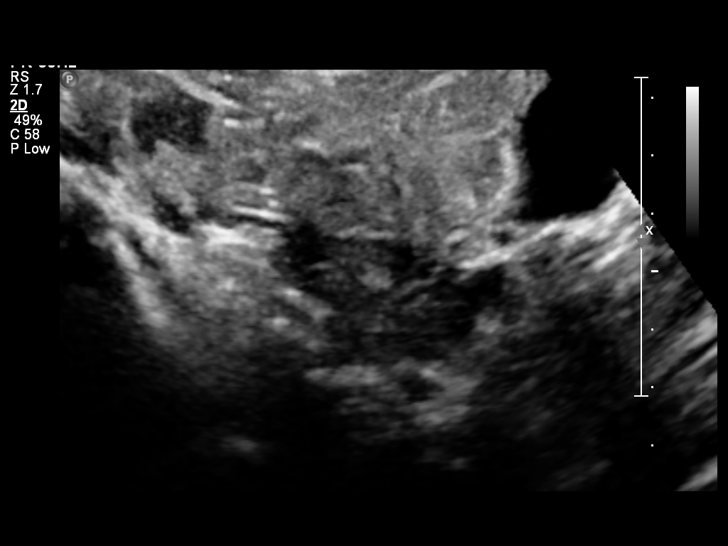
[im 22/28]
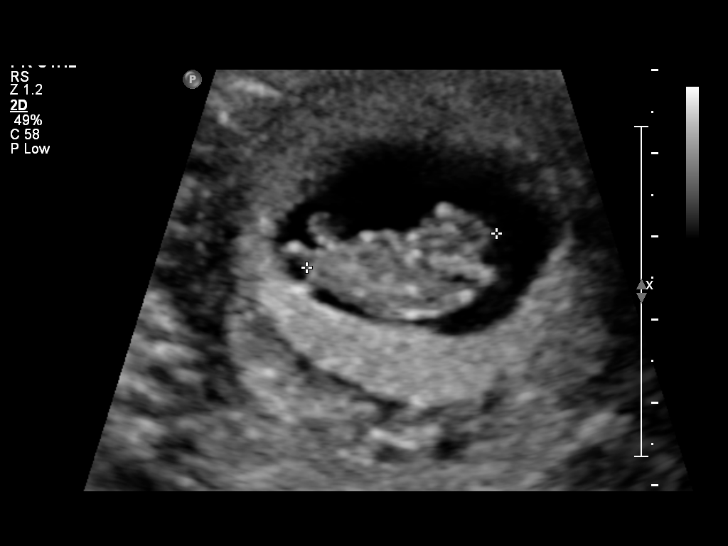
[im 24/28]
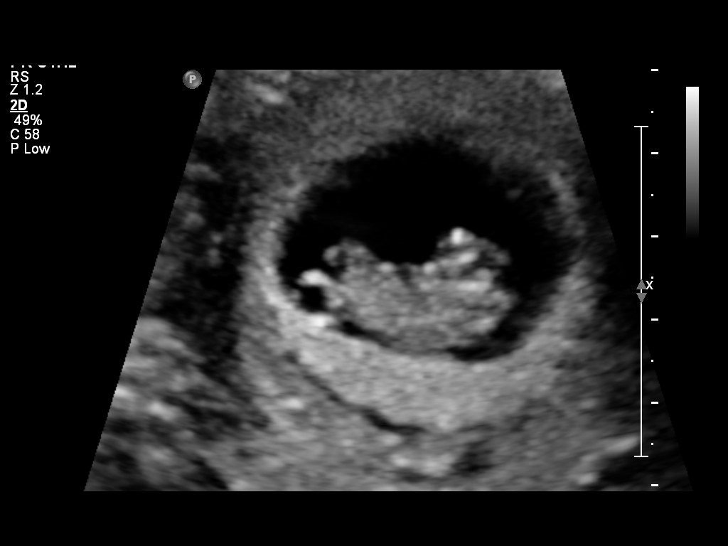
[im 26/28]
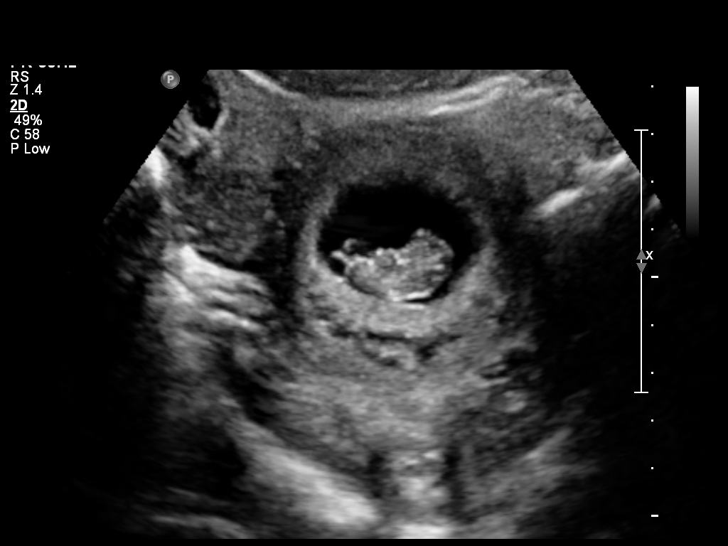
[im 28/28]
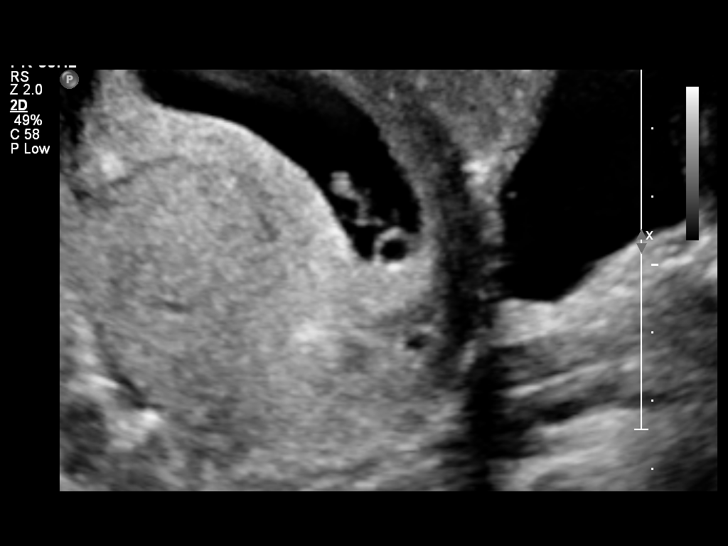

[14 of 28 positions shown; findings below may reference images not displayed]

FINDINGS: Intrauterine gestational sac: Visualized/normal in shape.

Yolk sac:  Visualized/ normal in shape.

Embryo:  Visualized

Cardiac Activity: Visualized

Heart Rate: 158 bpm

CRL:   22.7  mm   9 w 0 d                  US EDC: 11/04/2013

Maternal uterus/adnexae: No subchorionic hemorrhage. Normal
appearing maternal ovaries. No free peritoneal fluid.
IMPRESSION: Single live intrauterine gestation with an estimated gestational age
of 9 weeks and 0 days. No complicating features.

## 2017-03-10 DIAGNOSIS — Z8619 Personal history of other infectious and parasitic diseases: Secondary | ICD-10-CM

## 2017-03-10 HISTORY — DX: Personal history of other infectious and parasitic diseases: Z86.19

## 2017-04-01 ENCOUNTER — Inpatient Hospital Stay (HOSPITAL_COMMUNITY): Payer: Self-pay

## 2017-04-01 ENCOUNTER — Encounter (HOSPITAL_COMMUNITY): Payer: Self-pay

## 2017-04-01 ENCOUNTER — Inpatient Hospital Stay (HOSPITAL_COMMUNITY)
Admission: AD | Admit: 2017-04-01 | Discharge: 2017-04-01 | Disposition: A | Discharge status: Home / Self Care | Payer: Self-pay | Source: Ambulatory Visit | Attending: Obstetrics & Gynecology | Admitting: Obstetrics & Gynecology

## 2017-04-01 DIAGNOSIS — Z3A09 9 weeks gestation of pregnancy: Secondary | ICD-10-CM | POA: Insufficient documentation

## 2017-04-01 DIAGNOSIS — O021 Missed abortion: Secondary | ICD-10-CM | POA: Insufficient documentation

## 2017-04-01 DIAGNOSIS — B9689 Other specified bacterial agents as the cause of diseases classified elsewhere: Secondary | ICD-10-CM

## 2017-04-01 DIAGNOSIS — O26891 Other specified pregnancy related conditions, first trimester: Secondary | ICD-10-CM

## 2017-04-01 DIAGNOSIS — N760 Acute vaginitis: Secondary | ICD-10-CM | POA: Insufficient documentation

## 2017-04-01 DIAGNOSIS — O23591 Infection of other part of genital tract in pregnancy, first trimester: Secondary | ICD-10-CM | POA: Insufficient documentation

## 2017-04-01 DIAGNOSIS — R109 Unspecified abdominal pain: Secondary | ICD-10-CM

## 2017-04-01 DIAGNOSIS — O034 Incomplete spontaneous abortion without complication: Secondary | ICD-10-CM

## 2017-04-01 LAB — URINALYSIS, ROUTINE W REFLEX MICROSCOPIC
Bacteria, UA: NONE SEEN
Bilirubin Urine: NEGATIVE
Glucose, UA: NEGATIVE mg/dL
Ketones, ur: NEGATIVE mg/dL
Nitrite: NEGATIVE
PH: 5 (ref 5.0–8.0)
Protein, ur: NEGATIVE mg/dL
Specific Gravity, Urine: 1.025 (ref 1.005–1.030)

## 2017-04-01 LAB — WET PREP, GENITAL
SPERM: NONE SEEN
TRICH WET PREP: NONE SEEN
Yeast Wet Prep HPF POC: NONE SEEN

## 2017-04-01 LAB — CBC
HEMATOCRIT: 37.3 % (ref 36.0–46.0)
Hemoglobin: 12.6 g/dL (ref 12.0–15.0)
MCH: 26.3 pg (ref 26.0–34.0)
MCHC: 33.8 g/dL (ref 30.0–36.0)
MCV: 77.9 fL — AB (ref 78.0–100.0)
Platelets: 248 10*3/uL (ref 150–400)
RBC: 4.79 MIL/uL (ref 3.87–5.11)
RDW: 13.9 % (ref 11.5–15.5)
WBC: 6.5 10*3/uL (ref 4.0–10.5)

## 2017-04-01 LAB — POCT PREGNANCY, URINE: Preg Test, Ur: POSITIVE — AB

## 2017-04-01 LAB — HCG, QUANTITATIVE, PREGNANCY: hCG, Beta Chain, Quant, S: 5897 m[IU]/mL — ABNORMAL HIGH (ref <5)

## 2017-04-01 MED ORDER — PROMETHAZINE HCL 25 MG PO TABS: 12.5000 mg | ORAL_TABLET | Freq: Four times a day (QID) | ORAL | 2 refills | Status: AC | PRN

## 2017-04-01 MED ORDER — METRONIDAZOLE 500 MG PO TABS: 500.0000 mg | ORAL_TABLET | Freq: Two times a day (BID) | ORAL | 0 refills | Status: DC

## 2017-04-01 MED ORDER — MISOPROSTOL 200 MCG PO TABS
600.0000 ug | ORAL_TABLET | Freq: Once | ORAL | Status: AC
  Administered 2017-04-01: 600 ug via BUCCAL
  Filled 2017-04-01: qty 3

## 2017-04-01 MED ORDER — IBUPROFEN 600 MG PO TABS: 600.0000 mg | ORAL_TABLET | Freq: Four times a day (QID) | ORAL | 1 refills | Status: AC | PRN

## 2017-04-01 MED ORDER — OXYCODONE-ACETAMINOPHEN 5-325 MG PO TABS: 1.0000 | ORAL_TABLET | ORAL | 0 refills | Status: AC | PRN

## 2017-04-01 NOTE — Discharge Instructions (Signed)
Cytotec for Pregnancy Failure FACTS YOU SHOULD KNOW  WHAT IS AN EARLY PREGNANCY FAILURE? Once the egg is fertilized with the sperm and begins to develop, it attaches to the lining of the uterus. This early pregnancy tissue may not develop into an embryo (the beginning stage of a baby). Sometimes an embryo does develop but does not continue to grow. These problems can be seen on ultrasound.   MANAGEMNT OF EARLY PREGNANCY FAILURE: About 4 out of 100 (0.25%) women will have a pregnancy loss in her lifetime.  One in five pregnancies is found to be an early pregnancy failure.  There are 3 ways to care for an early pregnancy failure:   (1) Surgery, (2) Medicine, (3) Waiting for you to pass the pregnancy on your own. The decision as to how to proceed after being diagnosed with and early pregnancy failure is an individual one.  The decision can be made only after appropriate counseling.  You need to weigh the pros and cons of the 3 choices. Then you can make the choice that works for you. SURGERY (D&E) . Procedure over in 1 day . Requires being put to sleep . Bleeding may be light . Possible problems during surgery, including injury to womb(uterus) . Care provider has more control Medicine (CYTOTEC) . The complete procedure may take days to weeks . No Surgery . Bleeding may be heavy at times . There may be drug side effects . Patient has more control Waiting . You may choose to wait, in which case your own body may complete the passing of the abnormal early pregnancy on its own in about 2-4 weeks . Your bleeding may be heavy at times . There is a small possibility that you may need surgery if the bleeding is too much or not all of the pregnancy has passed. CYTOTEC MANAGEMENT Prostaglandins (cytotec) are the most widely used drug for this purpose. They cause the uterus to cramp and contract. You will place the medicine yourself inside your vagina in the privacy of your home. Empting of the uterus  should occur within 3 days but the process may continue for several weeks. The bleeding may seem heavy at times. POSSIBLE SIDE EFFECTS FROM CYTOTEC . Nausea   Vomiting . Diarrhea Fever . Chills  Hot Flashes Side effects  from the process of the early pregnancy failure include: . Cramping  Bleeding . Headaches  Dizziness RISKS: This is a low risk procedure. Less than 1 in 100 women has a complication. An incomplete passage of the early pregnancy may occur. Also, Hemorrhage (heavy bleeding) could happen.  Rarely the pregnancy will not be passed completely. Excessively heavy bleeding may occur.  Your doctor may need to perform surgery to empty the uterus (D&E). Afterwards: Everybody will feel differently after the early pregnancy completion. You may have soreness or cramps for a day or two. You may have soreness or cramps for day or two.  You may have light bleeding for up to 2 weeks. You may be as active as you feel like being. If you have any of the following problems you may call M[facility] Admissions Unit at [phone]. . If you have pain that does not get better  with pain medication . Bleeding that soaks through 2 thick full-sized sanitary pads in an hour . Cramps that last longer than 2 days . Foul smelling discharge . Fever above 100.4 degrees F Even if you do not have any of these symptoms, you should have a follow-up exam   to make sure you are healing properly. This appointment will be made for you before you leave the hospital. Your next normal period will start again in 4-6 week after the loss. You can get pregnant soon after the loss, so use birth control right away. Finally: Make sure all your questions are answered before during and after any procedure. Follow up with medical care and family planning methods.    Incomplete Miscarriage A miscarriage is the sudden loss of an unborn baby (fetus) before the 20th week of pregnancy. In an incomplete miscarriage, parts of the fetus or  placenta (afterbirth) remain in the body. Having a miscarriage can be an emotional experience. Talk with your health care provider about any questions you may have about miscarrying, the grieving process, and your future pregnancy plans. What are the causes?  Problems with the fetal chromosomes that make it impossible for the baby to develop normally. Problems with the baby's genes or chromosomes are most often the result of errors that occur by chance as the embryo divides and grows. The problems are not inherited from the parents.  Infection of the cervix or uterus.  Hormone problems.  Problems with the cervix, such as having an incompetent cervix. This is when the tissue in the cervix is not strong enough to hold the pregnancy.  Problems with the uterus, such as an abnormally shaped uterus, uterine fibroids, or congenital abnormalities.  Certain medical conditions.  Smoking, drinking alcohol, or taking illegal drugs.  Trauma. What are the signs or symptoms?  Vaginal bleeding or spotting, with or without cramps or pain.  Pain or cramping in the abdomen or lower back.  Passing fluid, tissue, or blood clots from the vagina. How is this diagnosed? Your health care provider will perform a physical exam. You may also have an ultrasound to confirm the miscarriage. Blood or urine tests may also be ordered. How is this treated?  In early pregnancy, a medication called Cytotec can be used to cause cramping/bleeding and prevent the need for surgery.  Sometimes, a dilation and curettage (D&C) procedure is performed. During a D&C procedure, the cervix is widened (dilated) and any remaining fetal or placental tissue is gently removed from the uterus.  Antibiotic medicines are prescribed if there is an infection. Other medicines may be given to reduce the size of the uterus (contract) if there is a lot of bleeding.  If you have Rh negative blood and your baby was Rh positive, you will need a  Rho (D) immune globulin shot. This shot will protect any future baby from having Rh blood problems in future pregnancies.  You may be confined to bed rest. This means you should stay in bed and only get up to use the bathroom. Follow these instructions at home:  Rest as directed by your health care provider.  Restrict activity as directed by your health care provider. You may be allowed to continue light activity if curettage was not done but you require further treatment.  Keep track of the number of pads you use each day. Keep track of how soaked (saturated) they are. Record this information.  Do not  use tampons.  Do not douche or have sexual intercourse until approved by your health care provider.  Keep all follow-up appointments for reevaluation and continuing management.  Only take over-the-counter or prescription medicines for pain, fever, or discomfort as directed by your health care provider.  Take antibiotic medicine as directed by your health care provider. Make sure you  finish it even if you start to feel better. Get help right away if:  You experience severe cramps in your stomach, back, or abdomen.  You have an unexplained temperature (make sure to record these temperatures).  You pass large clots or tissue (save these for your health care provider to inspect).  Your bleeding increases.  You become light-headed, weak, or have fainting episodes. This information is not intended to replace advice given to you by your health care provider. Make sure you discuss any questions you have with your health care provider. Document Released: 03/27/2005 Document Revised: 09/02/2015 Document Reviewed: 10/24/2012 [employer] Interactive Patient Education  2017 [employer]

## 2017-04-01 NOTE — MAU Provider Note (Signed)
Chief Complaint: Abdominal Pain and Vaginal Discharge   First Provider Initiated Contact with Patient 04/01/17 1349      SUBJECTIVE HPI: Lynn Wright is a 26 y.o. G3P1011 at 3346w1d by sure LMP who presents to maternity admissions reporting abdominal pain and vaginal discharge x 2 weeks. She reports the vaginal discharge is sometimes associated with light red/pink bleeding.  There is an odor with the discharge and she reports hx of BV with similar odor.  The bleeding has never required a pad but sometimes a panty liner.  She denies any itching/burning with discharge. Her pain is low in her abdomen, cramping intermittent pain located in the middle and both sides of low abdomen. She has not tried any treatments for bleeding or pain.There are no other associated symptoms.       HPI  No past medical history on file. Past Surgical History:  Procedure Laterality Date  . CESAREAN SECTION N/A 11/17/2013   Procedure: CESAREAN SECTION;  Surgeon: Essie HartWalda Pinn, MD;  Location: [facility];  Service: Obstetrics;  Laterality: N/A;   Social History   Socioeconomic History  . Marital status: Single    Spouse name: Not on file  . Number of children: Not on file  . Years of education: Not on file  . Highest education level: Not on file  Social Needs  . Financial resource strain: Not on file  . Food insecurity - worry: Not on file  . Food insecurity - inability: Not on file  . Transportation needs - medical: Not on file  . Transportation needs - non-medical: Not on file  Occupational History  . Not on file  Tobacco Use  . Smoking status: Never Smoker  . Smokeless tobacco: Never Used  Substance and Sexual Activity  . Alcohol use: No  . Drug use: No  . Sexual activity: Yes  Other Topics Concern  . Not on file  Social History Narrative  . Not on file   No current facility-administered medications on file prior to encounter.    No current outpatient medications on file prior to encounter.   No  Known Allergies  ROS:  Review of Systems  Constitutional: Negative for chills, fatigue and fever.  Respiratory: Negative for shortness of breath.   Cardiovascular: Negative for chest pain.  Genitourinary: Positive for pelvic pain, vaginal bleeding and vaginal discharge. Negative for difficulty urinating, dysuria, flank pain and vaginal pain.  Neurological: Negative for dizziness and headaches.  Psychiatric/Behavioral: Negative.      I have reviewed patient's Past Medical Hx, Surgical Hx, Family Hx, Social Hx, medications and allergies.   Physical Exam   Patient Vitals for the past 24 hrs:  BP Temp Temp src Pulse Resp Height Weight  04/01/17 1232 - - - - - 4\' 11"  (1.499 m) 166 lb 1.9 oz (75.4 kg)  04/01/17 1228 135/85 98.2 F (36.8 C) Oral (!) 118 16 - -   Constitutional: Well-developed, well-nourished female in no acute distress.  Cardiovascular: normal rate Respiratory: normal effort GI: Abd soft, non-tender. Pos BS x 4 MS: Extremities nontender, no edema, normal ROM Neurologic: Alert and oriented x 4.  GU: Neg CVAT.  PELVIC EXAM: Wet prep/GC collected by blind swab, swabs noted to be pink/light red   LAB RESULTS Results for orders placed or performed during the hospital encounter of 04/01/17 (from the past 24 hour(s))  Urinalysis, Routine w reflex microscopic     Status: Abnormal   Collection Time: 04/01/17 12:25 PM  Result Value Ref Range  Color, Urine YELLOW YELLOW   APPearance CLEAR CLEAR   Specific Gravity, Urine 1.025 1.005 - 1.030   pH 5.0 5.0 - 8.0   Glucose, UA NEGATIVE NEGATIVE mg/dL   Hgb urine dipstick LARGE (A) NEGATIVE   Bilirubin Urine NEGATIVE NEGATIVE   Ketones, ur NEGATIVE NEGATIVE mg/dL   Protein, ur NEGATIVE NEGATIVE mg/dL   Nitrite NEGATIVE NEGATIVE   Leukocytes, UA TRACE (A) NEGATIVE   RBC / HPF 0-5 0 - 5 RBC/hpf   WBC, UA 0-5 0 - 5 WBC/hpf   Bacteria, UA NONE SEEN NONE SEEN   Squamous Epithelial / LPF 0-5 (A) NONE SEEN   Mucus PRESENT    Pregnancy, urine POC     Status: Abnormal   Collection Time: 04/01/17 12:28 PM  Result Value Ref Range   Preg Test, Ur POSITIVE (A) NEGATIVE  Wet prep, genital     Status: Abnormal   Collection Time: 04/01/17 12:59 PM  Result Value Ref Range   Yeast Wet Prep HPF POC NONE SEEN NONE SEEN   Trich, Wet Prep NONE SEEN NONE SEEN   Clue Cells Wet Prep HPF POC PRESENT (A) NONE SEEN   WBC, Wet Prep HPF POC FEW (A) NONE SEEN   Sperm NONE SEEN   CBC     Status: Abnormal   Collection Time: 04/01/17  1:08 PM  Result Value Ref Range   WBC 6.5 4.0 - 10.5 K/uL   RBC 4.79 3.87 - 5.11 MIL/uL   Hemoglobin 12.6 12.0 - 15.0 g/dL   HCT 09.837.3 11.936.0 - 14.746.0 %   MCV 77.9 (L) 78.0 - 100.0 fL   MCH 26.3 26.0 - 34.0 pg   MCHC 33.8 30.0 - 36.0 g/dL   RDW 82.913.9 56.211.5 - 13.015.5 %   Platelets 248 150 - 400 K/uL  hCG, quantitative, pregnancy     Status: Abnormal   Collection Time: 04/01/17  1:08 PM  Result Value Ref Range   hCG, Beta Chain, Quant, S 5,897 (H) <5 mIU/mL       IMAGING I performed bedside abdominal US which revealed an endometrial stripe with no evidence of IUP.  Adnexa not well visualized by abdominal US.   [place] Ob Comp Less 14 Wks  Result Date: 04/01/2017 CLINICAL DATA:  Initial evaluation for cute abdominal pain, early pregnancy. EXAM: OBSTETRIC <14 WK US AND TRANSVAGINAL OB US TECHNIQUE: Both transabdominal and transvaginal ultrasound examinations were performed for complete evaluation of the gestation as well as the maternal uterus, adnexal regions, and pelvic cul-de-sac. Transvaginal technique was performed to assess early pregnancy. COMPARISON:  None available. FINDINGS: Intrauterine gestational sac: Single Yolk sac:  Present Embryo:  Present Cardiac Activity: Absent Heart Rate: 0  bpm CRL:  7.4  mm   6 w   4 d                  US EDC: 11/21/2017 Subchorionic hemorrhage:  None visualized. Maternal uterus/adnexae: Ovaries are normal in appearance bilaterally. No adnexal mass. No free fluid within  the pelvis. IMPRESSION: 1. Single IUP with internal yolk sac and embryo, with crown-rump length measuring 7.4 mm, but with no discernible cardiac activity. Findings meet definitive criteria for failed pregnancy. This follows SRU consensus guidelines: Diagnostic Criteria for Nonviable Pregnancy Early in the First Trimester. Macy Mis Engl J Med [id number]. 2. No other acute maternal uterine or adnexal abnormality identified. Electronically Signed   By: Rise MuBenjamin  McClintock M.D.   On: 04/01/2017 15:18   [place] Ob Transvaginal  Result  Date: 04/01/2017 CLINICAL DATA:  Initial evaluation for cute abdominal pain, early pregnancy. EXAM: OBSTETRIC <14 WK [place] AND TRANSVAGINAL OB US TECHNIQUE: Both transabdominal and transvaginal ultrasound examinations were performed for complete evaluation of the gestation as well as the maternal uterus, adnexal regions, and pelvic cul-de-sac. Transvaginal technique was performed to assess early pregnancy. COMPARISON:  None available. FINDINGS: Intrauterine gestational sac: Single Yolk sac:  Present Embryo:  Present Cardiac Activity: Absent Heart Rate: 0  bpm CRL:  7.4  mm   6 w   4 d                  [place] EDC: 11/21/2017 Subchorionic hemorrhage:  None visualized. Maternal uterus/adnexae: Ovaries are normal in appearance bilaterally. No adnexal mass. No free fluid within the pelvis. IMPRESSION: 1. Single IUP with internal yolk sac and embryo, with crown-rump length measuring 7.4 mm, but with no discernible cardiac activity. Findings meet definitive criteria for failed pregnancy. This follows SRU consensus guidelines: Diagnostic Criteria for Nonviable Pregnancy Early in the First Trimester. Macy Mis J Med [phone]. 2. No other acute maternal uterine or adnexal abnormality identified. Electronically Signed   By: Rise Mu M.D.   On: 04/01/2017 15:18    MAU Management/MDM: Ordered labs and [place] and reviewed results.  US findings definitive for failed pregnancy.  Offered  expectant management, Cytotec, or D&C, and discussed benefits/risks of each. Pt prefers Cytotec today.  Cytotec 600 mcg buccally given in MAU.  Pt to f/u in [facility] office in 1 week for hcg and 2 weeks for provider visit.  Rx for ibuprofen, Percocet, and Phenergan PRN.  Pt discharged with strict bleeding precautions.  ASSESSMENT 1. Missed ab   2. Abdominal pain during pregnancy in first trimester   3. Incomplete miscarriage   4. BV (bacterial vaginosis)     PLAN Discharge home with bleeding precautions Close f/u in office  Allergies as of 04/01/2017   No Known Allergies     Medication List    TAKE these medications   ibuprofen 600 MG tablet Commonly known as:  ADVIL,MOTRIN Take 1 tablet (600 mg total) by mouth every 6 (six) hours as needed.   metroNIDAZOLE 500 MG tablet Commonly known as:  FLAGYL Take 1 tablet (500 mg total) by mouth 2 (two) times daily.   oxyCODONE-acetaminophen 5-325 MG tablet Commonly known as:  PERCOCET/ROXICET Take 1 tablet by mouth every 4 (four) hours as needed for severe pain.   promethazine 25 MG tablet Commonly known as:  PHENERGAN Take 0.5-1 tablets (12.5-25 mg total) by mouth every 6 (six) hours as needed.      Follow-up Information    [facility].   Specialty:  Obstetrics and Gynecology Why:  The office will call you with an appointment in 1 week for labs, and in 2 weeks to see the provider. Return to MAU as needed for emergencies. Contact information: [address] [place] [address] [phone]          Sharen Counter Certified Nurse-Midwife 04/01/2017  4:49 PM

## 2017-04-01 NOTE — MAU Note (Signed)
Patient presents with onset of stomach pain x 1 week, vaginal discharge x 2 weeks, LMP 01/27/17, positive home UPT

## 2017-04-02 LAB — HIV ANTIBODY (ROUTINE TESTING W REFLEX): HIV Screen 4th Generation wRfx: NONREACTIVE

## 2017-04-02 LAB — GC/CHLAMYDIA PROBE AMP (~~LOC~~) NOT AT ARMC
Chlamydia: POSITIVE — AB
NEISSERIA GONORRHEA: NEGATIVE

## 2017-04-05 ENCOUNTER — Telehealth: Payer: Self-pay | Admitting: Student

## 2017-04-05 DIAGNOSIS — A749 Chlamydial infection, unspecified: Secondary | ICD-10-CM

## 2017-04-05 MED ORDER — AZITHROMYCIN 500 MG PO TABS: 1000.0000 mg | ORAL_TABLET | Freq: Once | ORAL | 0 refills | Status: AC

## 2017-04-05 NOTE — Telephone Encounter (Addendum)
Anika L. Coach tested positive for  Chlamydia. Patient was called by RN and allergies and pharmacy confirmed. Rx sent to pharmacy of choice.   Judeth HornLawrence, Detria Cummings, NP 04/05/2017 4:55 PM       ----- Message from Kathe BectonLori S Berdik, RN sent at 04/05/2017  3:06 PM EST ----- This patient tested positive for :  Chlamydia  She "has NKDA"I have informed the patient of her results and confirmed her pharmacy is correct in her chart. Please send Rx.   Thank you,   Kathe BectonBerdik, Lori S, RN   [facility]

## 2017-04-13 ENCOUNTER — Other Ambulatory Visit: Payer: Self-pay

## 2017-04-27 ENCOUNTER — Encounter: Payer: Self-pay | Admitting: Student

## 2017-04-30 ENCOUNTER — Encounter: Payer: Self-pay | Admitting: Student

## 2017-04-30 ENCOUNTER — Other Ambulatory Visit: Payer: Self-pay | Admitting: General Practice

## 2017-04-30 DIAGNOSIS — N760 Acute vaginitis: Principal | ICD-10-CM

## 2017-04-30 DIAGNOSIS — B9689 Other specified bacterial agents as the cause of diseases classified elsewhere: Secondary | ICD-10-CM

## 2017-04-30 DIAGNOSIS — A749 Chlamydial infection, unspecified: Secondary | ICD-10-CM

## 2017-04-30 MED ORDER — METRONIDAZOLE 500 MG PO TABS: 500.0000 mg | ORAL_TABLET | Freq: Two times a day (BID) | ORAL | 0 refills | Status: AC

## 2017-04-30 MED ORDER — AZITHROMYCIN 250 MG PO TABS: 1000.0000 mg | ORAL_TABLET | Freq: Once | ORAL | 0 refills | Status: AC

## 2017-05-01 ENCOUNTER — Encounter: Payer: Self-pay | Admitting: Student

## 2018-01-26 ENCOUNTER — Encounter (HOSPITAL_COMMUNITY): Payer: Self-pay

## 2019-02-18 IMAGING — US US OB COMP LESS 14 WK
1 series · 15 of 28 positions shown · non-contrast
Comparison: None available.

CLINICAL DATA: Initial evaluation for cute abdominal pain, early
pregnancy.

EXAM:
OBSTETRIC <14 WK US AND TRANSVAGINAL OB US
TECHNIQUE: Both transabdominal and transvaginal ultrasound examinations were
performed for complete evaluation of the gestation as well as the
maternal uterus, adnexal regions, and pelvic cul-de-sac.
Transvaginal technique was performed to assess early pregnancy.

[Series 1: us ob comp less 14 wk · 31 acquisitions, 15 frames shown]
[im 1/31]
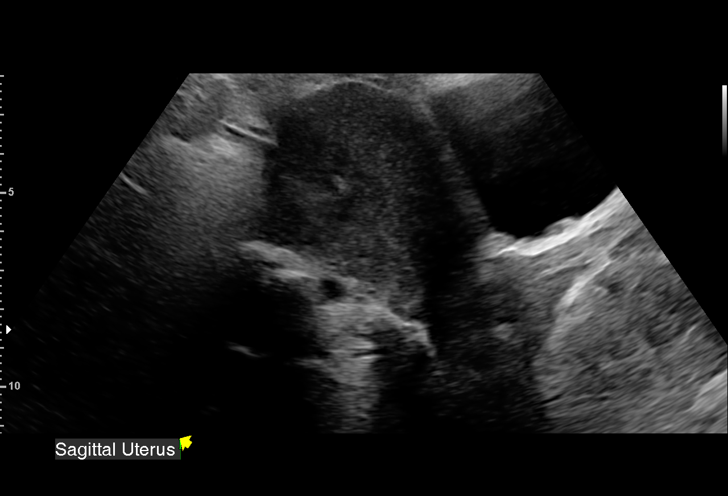
[im 3/31]
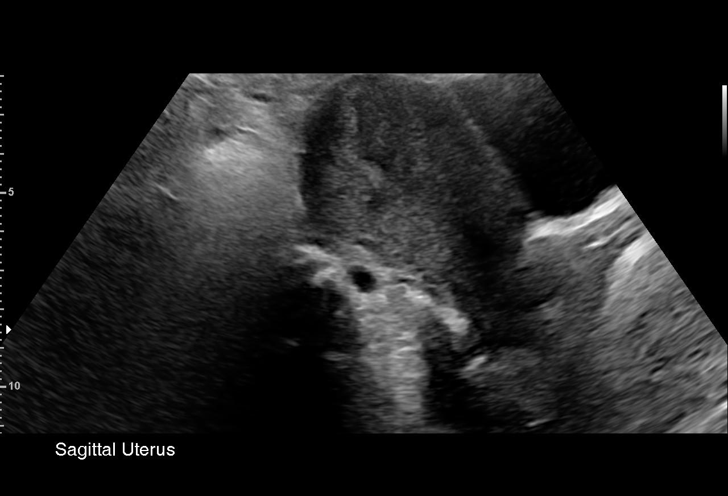
[im 5/31]
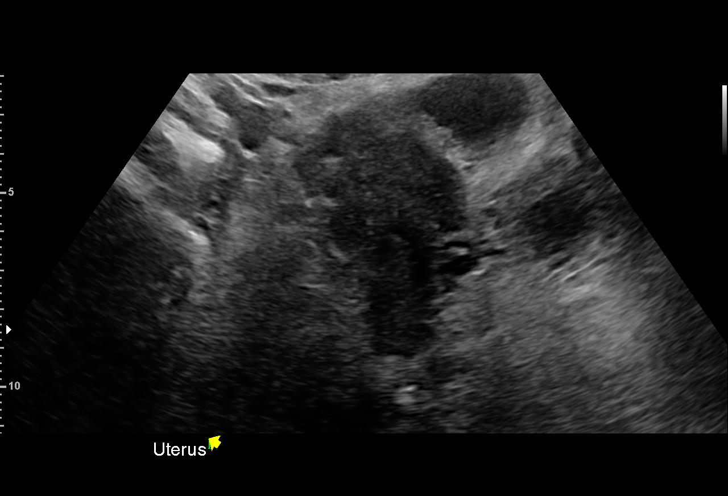
[im 7/31]
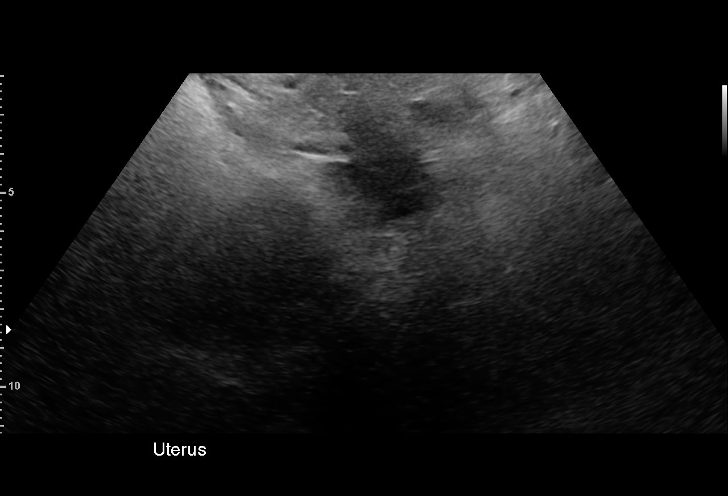
[im 9/31]
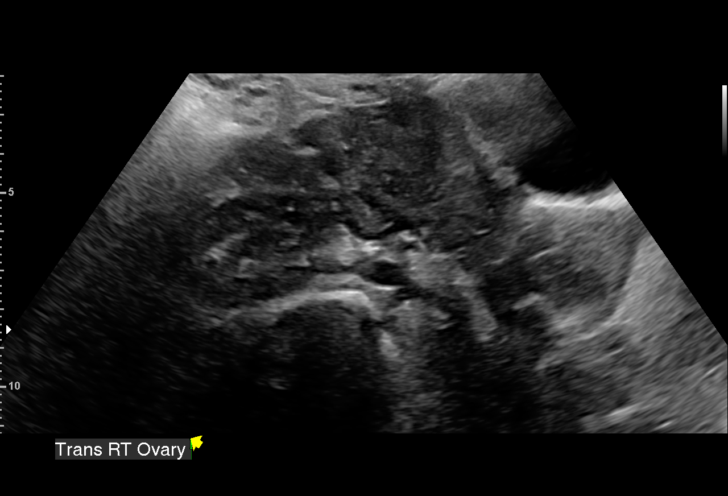
[im 12/31]
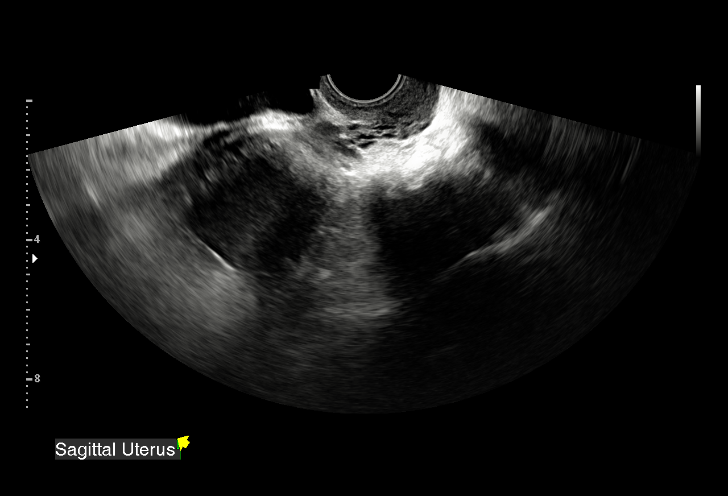
[im 14/31]
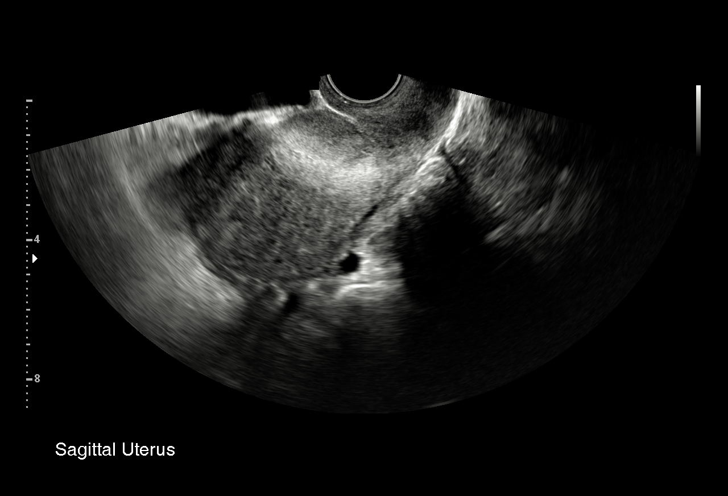
[im 16/31]
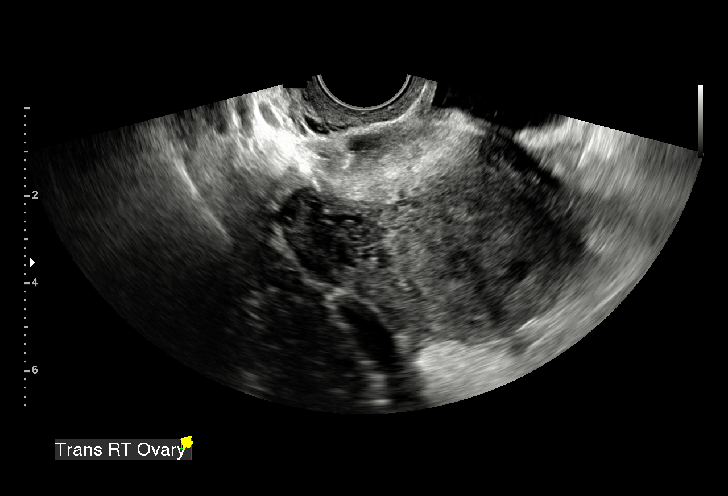
[im 17/31]
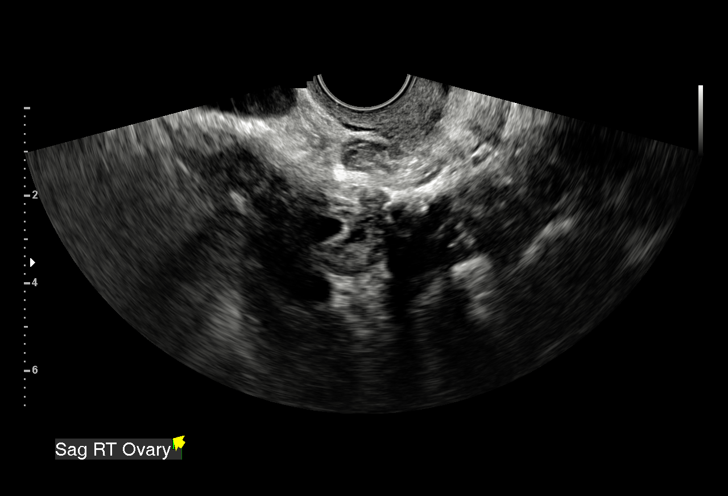
[im 19/31]
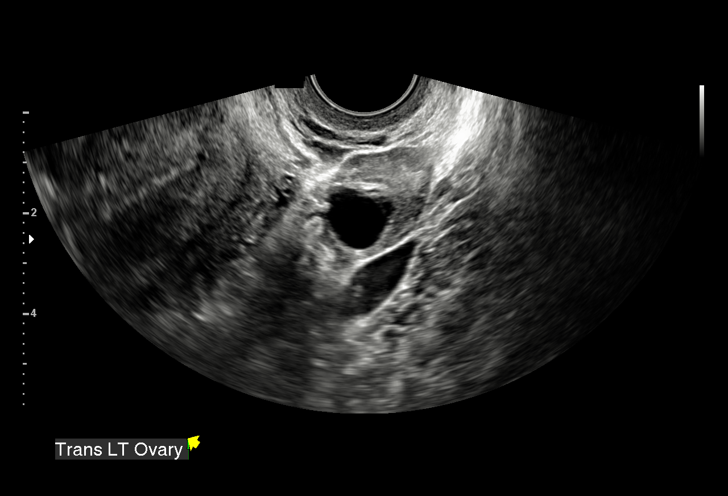
[im 22/31]
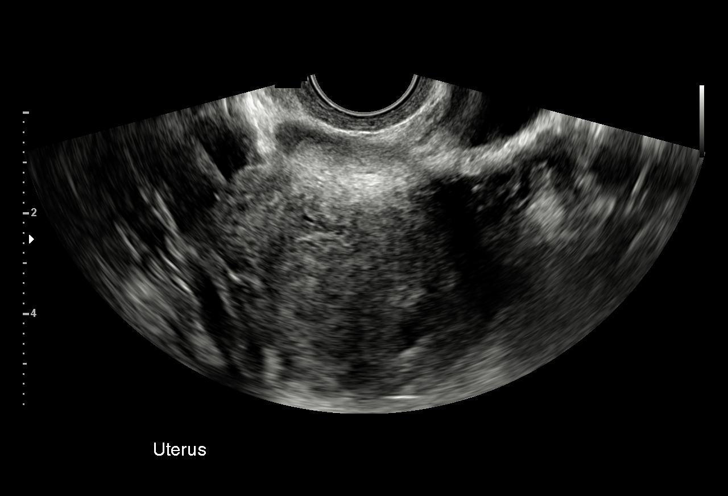
[im 24/31]
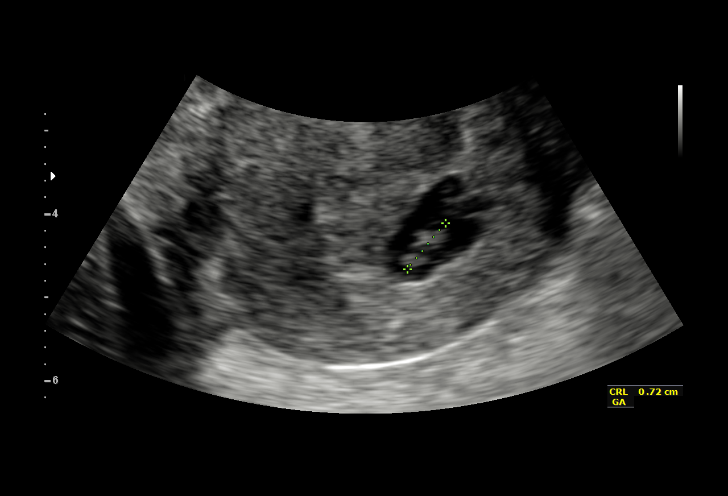
[im 26/31]
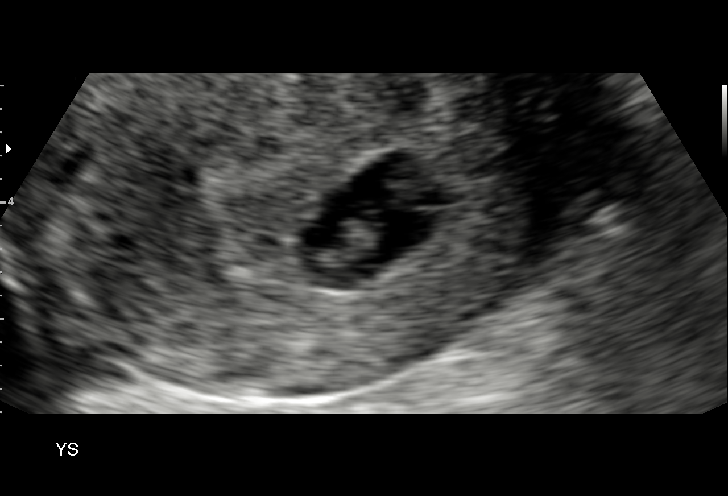
[im 28/31]
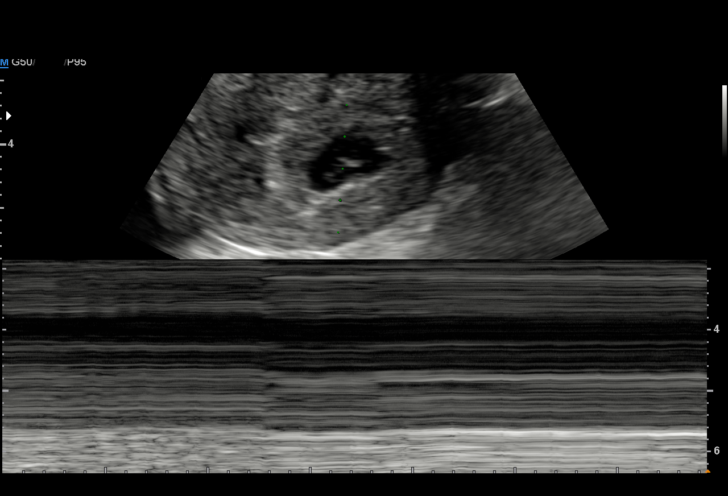
[im 31/31]
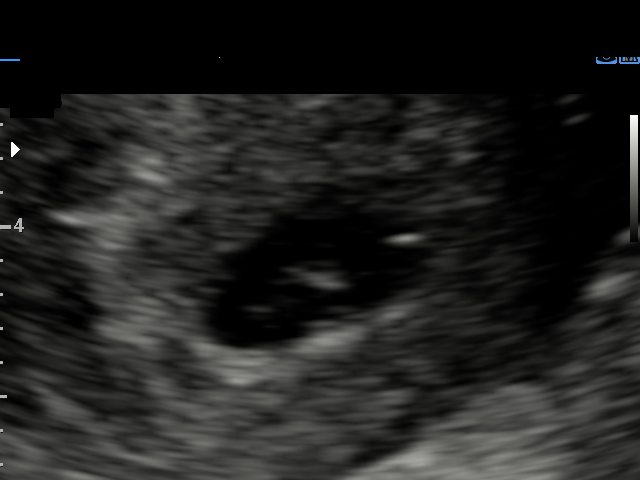

[15 of 28 positions shown; findings below may reference images not displayed]

FINDINGS: Intrauterine gestational sac: Single

Yolk sac:  Present

Embryo:  Present

Cardiac Activity: Absent

Heart Rate: 0  bpm

CRL:  7.4  mm   6 w   4 d                  US EDC: 11/21/2017

Subchorionic hemorrhage:  None visualized.

Maternal uterus/adnexae: Ovaries are normal in appearance
bilaterally. No adnexal mass. No free fluid within the pelvis.
IMPRESSION: 1. Single IUP with internal yolk sac and embryo, with crown-rump
length measuring 7.4 mm, but with no discernible cardiac activity.
Findings meet definitive criteria for failed pregnancy. This follows
SRU consensus guidelines: Diagnostic Criteria for Nonviable
Pregnancy Early in the First Trimester. N Engl J Med
0366;[DATE].
2. No other acute maternal uterine or adnexal abnormality
identified.
